# Patient Record
Sex: Female | Born: 1960 | Race: White | Hispanic: No | Marital: Married | Smoking: Former smoker
Health system: Southern US, Community
[De-identification: ages and names within clinical notes are randomized; demographics above are authoritative.]

## PROBLEM LIST (undated history)

## (undated) DIAGNOSIS — K219 Gastro-esophageal reflux disease without esophagitis: Secondary | ICD-10-CM

## (undated) DIAGNOSIS — E669 Obesity, unspecified: Secondary | ICD-10-CM

## (undated) DIAGNOSIS — F329 Major depressive disorder, single episode, unspecified: Secondary | ICD-10-CM

## (undated) DIAGNOSIS — F419 Anxiety disorder, unspecified: Secondary | ICD-10-CM

## (undated) DIAGNOSIS — T7840XA Allergy, unspecified, initial encounter: Secondary | ICD-10-CM

## (undated) DIAGNOSIS — F32A Depression, unspecified: Secondary | ICD-10-CM

## (undated) DIAGNOSIS — G4733 Obstructive sleep apnea (adult) (pediatric): Secondary | ICD-10-CM

## (undated) DIAGNOSIS — K52832 Lymphocytic colitis: Secondary | ICD-10-CM

## (undated) HISTORY — PX: TUBAL LIGATION: SHX77

## (undated) HISTORY — DX: Obesity, unspecified: E66.9

## (undated) HISTORY — DX: Lymphocytic colitis: K52.832

## (undated) HISTORY — DX: Depression, unspecified: F32.A

## (undated) HISTORY — DX: Obstructive sleep apnea (adult) (pediatric): G47.33

## (undated) HISTORY — DX: Gastro-esophageal reflux disease without esophagitis: K21.9

## (undated) HISTORY — DX: Allergy, unspecified, initial encounter: T78.40XA

## (undated) HISTORY — PX: COLONOSCOPY: SHX174

## (undated) HISTORY — DX: Major depressive disorder, single episode, unspecified: F32.9

## (undated) HISTORY — DX: Anxiety disorder, unspecified: F41.9

## (undated) HISTORY — PX: POLYPECTOMY: SHX149

---

## 2000-05-30 ENCOUNTER — Other Ambulatory Visit: Admission: RE | Admit: 2000-05-30 | Discharge: 2000-05-30 | Payer: Self-pay | Admitting: Obstetrics and Gynecology

## 2001-06-01 ENCOUNTER — Other Ambulatory Visit: Admission: RE | Admit: 2001-06-01 | Discharge: 2001-06-01 | Payer: Self-pay | Admitting: Obstetrics and Gynecology

## 2004-03-08 ENCOUNTER — Ambulatory Visit: Payer: Self-pay | Admitting: Internal Medicine

## 2004-07-03 ENCOUNTER — Ambulatory Visit: Payer: Self-pay | Admitting: Internal Medicine

## 2004-07-19 ENCOUNTER — Ambulatory Visit: Payer: Self-pay | Admitting: Internal Medicine

## 2005-10-02 ENCOUNTER — Ambulatory Visit: Payer: Self-pay | Admitting: Internal Medicine

## 2005-10-16 ENCOUNTER — Ambulatory Visit: Payer: Self-pay | Admitting: Internal Medicine

## 2005-10-22 ENCOUNTER — Ambulatory Visit: Payer: Self-pay | Admitting: Family Medicine

## 2005-10-30 ENCOUNTER — Ambulatory Visit: Payer: Self-pay | Admitting: Internal Medicine

## 2005-11-20 ENCOUNTER — Ambulatory Visit: Payer: Self-pay | Admitting: Internal Medicine

## 2006-01-01 ENCOUNTER — Ambulatory Visit: Payer: Self-pay | Admitting: Internal Medicine

## 2006-03-04 ENCOUNTER — Ambulatory Visit: Payer: Self-pay | Admitting: Internal Medicine

## 2006-06-10 ENCOUNTER — Ambulatory Visit: Payer: Self-pay | Admitting: Internal Medicine

## 2006-07-17 ENCOUNTER — Encounter: Payer: Self-pay | Admitting: *Deleted

## 2006-07-17 DIAGNOSIS — L719 Rosacea, unspecified: Secondary | ICD-10-CM | POA: Insufficient documentation

## 2006-07-17 DIAGNOSIS — F39 Unspecified mood [affective] disorder: Secondary | ICD-10-CM

## 2006-07-17 DIAGNOSIS — J309 Allergic rhinitis, unspecified: Secondary | ICD-10-CM | POA: Insufficient documentation

## 2006-12-30 ENCOUNTER — Ambulatory Visit: Payer: Self-pay | Admitting: Internal Medicine

## 2007-09-18 ENCOUNTER — Ambulatory Visit: Payer: Self-pay | Admitting: Internal Medicine

## 2007-09-18 DIAGNOSIS — R5381 Other malaise: Secondary | ICD-10-CM | POA: Insufficient documentation

## 2007-09-18 DIAGNOSIS — F329 Major depressive disorder, single episode, unspecified: Secondary | ICD-10-CM | POA: Insufficient documentation

## 2007-09-18 DIAGNOSIS — R5383 Other fatigue: Secondary | ICD-10-CM

## 2007-09-18 DIAGNOSIS — K219 Gastro-esophageal reflux disease without esophagitis: Secondary | ICD-10-CM

## 2007-09-18 DIAGNOSIS — R109 Unspecified abdominal pain: Secondary | ICD-10-CM | POA: Insufficient documentation

## 2007-09-21 LAB — CONVERTED CEMR LAB
ALT: 39 units/L — ABNORMAL HIGH (ref 0–35)
Alkaline Phosphatase: 80 units/L (ref 39–117)
BUN: 10 mg/dL (ref 6–23)
Basophils Absolute: 0 10*3/uL (ref 0.0–0.1)
Basophils Relative: 0 % (ref 0–1)
Calcium: 9.8 mg/dL (ref 8.4–10.5)
Chloride: 103 meq/L (ref 96–112)
Eosinophils Relative: 2 % (ref 0–5)
Glucose, Bld: 113 mg/dL — ABNORMAL HIGH (ref 70–99)
HCT: 45 % (ref 36.0–46.0)
Lymphs Abs: 4.2 10*3/uL — ABNORMAL HIGH (ref 0.7–4.0)
MCHC: 32.9 g/dL (ref 30.0–36.0)
Monocytes Relative: 7 % (ref 3–12)
Neutro Abs: 6.2 10*3/uL (ref 1.7–7.7)
Neutrophils Relative %: 54 % (ref 43–77)
Platelets: 364 10*3/uL (ref 150–400)
RDW: 13.1 % (ref 11.5–15.5)
TSH: 2.253 microintl units/mL (ref 0.350–5.50)
Total Bilirubin: 0.4 mg/dL (ref 0.3–1.2)
Total Protein: 7.2 g/dL (ref 6.0–8.3)

## 2007-10-14 ENCOUNTER — Ambulatory Visit: Payer: Self-pay | Admitting: Internal Medicine

## 2007-10-14 DIAGNOSIS — R197 Diarrhea, unspecified: Secondary | ICD-10-CM

## 2007-10-14 DIAGNOSIS — R1084 Generalized abdominal pain: Secondary | ICD-10-CM | POA: Insufficient documentation

## 2007-10-14 DIAGNOSIS — K625 Hemorrhage of anus and rectum: Secondary | ICD-10-CM

## 2007-11-03 ENCOUNTER — Encounter: Payer: Self-pay | Admitting: Internal Medicine

## 2007-11-03 ENCOUNTER — Ambulatory Visit: Payer: Self-pay | Admitting: Internal Medicine

## 2007-11-03 LAB — HM COLONOSCOPY

## 2007-11-05 ENCOUNTER — Ambulatory Visit: Payer: Self-pay | Admitting: Internal Medicine

## 2007-11-08 ENCOUNTER — Encounter: Payer: Self-pay | Admitting: Internal Medicine

## 2007-12-02 ENCOUNTER — Ambulatory Visit: Payer: Self-pay | Admitting: Internal Medicine

## 2007-12-02 DIAGNOSIS — Z860100 Personal history of colon polyps, unspecified: Secondary | ICD-10-CM | POA: Insufficient documentation

## 2007-12-02 DIAGNOSIS — K5730 Diverticulosis of large intestine without perforation or abscess without bleeding: Secondary | ICD-10-CM | POA: Insufficient documentation

## 2007-12-02 DIAGNOSIS — K5289 Other specified noninfective gastroenteritis and colitis: Secondary | ICD-10-CM

## 2008-01-08 ENCOUNTER — Encounter: Payer: Self-pay | Admitting: Internal Medicine

## 2008-02-18 ENCOUNTER — Emergency Department (HOSPITAL_COMMUNITY): Admission: EM | Admit: 2008-02-18 | Discharge: 2008-02-18 | Payer: Self-pay | Admitting: Emergency Medicine

## 2008-02-23 ENCOUNTER — Ambulatory Visit (HOSPITAL_COMMUNITY): Admission: RE | Admit: 2008-02-23 | Discharge: 2008-02-24 | Payer: Self-pay | Admitting: Orthopaedic Surgery

## 2008-08-05 ENCOUNTER — Ambulatory Visit: Payer: Self-pay | Admitting: Internal Medicine

## 2008-08-05 ENCOUNTER — Encounter: Payer: Self-pay | Admitting: Internal Medicine

## 2008-08-05 ENCOUNTER — Other Ambulatory Visit: Admission: RE | Admit: 2008-08-05 | Discharge: 2008-08-05 | Payer: Self-pay | Admitting: Internal Medicine

## 2008-08-05 DIAGNOSIS — J4520 Mild intermittent asthma, uncomplicated: Secondary | ICD-10-CM

## 2008-08-05 LAB — HM PAP SMEAR

## 2008-08-09 ENCOUNTER — Encounter: Payer: Self-pay | Admitting: Internal Medicine

## 2008-08-09 LAB — CONVERTED CEMR LAB
ALT: 22 units/L (ref 0–35)
AST: 23 units/L (ref 0–37)
BUN: 7 mg/dL (ref 6–23)
Bilirubin, Direct: 0.1 mg/dL (ref 0.0–0.3)
Calcium: 9.2 mg/dL (ref 8.4–10.5)
Eosinophils Absolute: 0.3 10*3/uL (ref 0.0–0.7)
Eosinophils Relative: 2.9 % (ref 0.0–5.0)
Glucose, Bld: 94 mg/dL (ref 70–99)
HDL: 45 mg/dL (ref >39.00)
Lymphocytes Relative: 35 % (ref 12.0–46.0)
Monocytes Relative: 6.8 % (ref 3.0–12.0)
Neutrophils Relative %: 54.5 % (ref 43.0–77.0)
Platelets: 336 10*3/uL (ref 150.0–400.0)
RBC: 4.39 M/uL (ref 3.87–5.11)
RDW: 12.8 % (ref 11.5–14.6)
TSH: 1.85 microintl units/mL (ref 0.35–5.50)
Triglycerides: 166 mg/dL — ABNORMAL HIGH (ref 0.0–149.0)
WBC: 8.9 10*3/uL (ref 4.5–10.5)

## 2008-08-17 ENCOUNTER — Ambulatory Visit: Payer: Self-pay | Admitting: Pulmonary Disease

## 2008-09-06 ENCOUNTER — Encounter: Payer: Self-pay | Admitting: Pulmonary Disease

## 2008-09-06 ENCOUNTER — Ambulatory Visit (HOSPITAL_BASED_OUTPATIENT_CLINIC_OR_DEPARTMENT_OTHER): Admission: RE | Admit: 2008-09-06 | Discharge: 2008-09-06 | Payer: Self-pay | Admitting: Pulmonary Disease

## 2008-09-13 ENCOUNTER — Ambulatory Visit: Payer: Self-pay | Admitting: Pulmonary Disease

## 2008-09-16 ENCOUNTER — Encounter: Payer: Self-pay | Admitting: Pulmonary Disease

## 2008-09-20 ENCOUNTER — Ambulatory Visit: Payer: Self-pay | Admitting: Pulmonary Disease

## 2008-09-20 DIAGNOSIS — G4733 Obstructive sleep apnea (adult) (pediatric): Secondary | ICD-10-CM

## 2008-09-20 DIAGNOSIS — F17200 Nicotine dependence, unspecified, uncomplicated: Secondary | ICD-10-CM

## 2008-11-02 ENCOUNTER — Encounter: Payer: Self-pay | Admitting: Pulmonary Disease

## 2008-11-28 ENCOUNTER — Ambulatory Visit: Payer: Self-pay | Admitting: Pulmonary Disease

## 2008-12-27 ENCOUNTER — Encounter: Payer: Self-pay | Admitting: Pulmonary Disease

## 2009-02-01 ENCOUNTER — Ambulatory Visit: Payer: Self-pay | Admitting: Internal Medicine

## 2009-02-20 HISTORY — PX: ANKLE SURGERY: SHX546

## 2009-05-15 ENCOUNTER — Ambulatory Visit: Payer: Self-pay | Admitting: Internal Medicine

## 2009-05-15 ENCOUNTER — Telehealth: Payer: Self-pay | Admitting: Internal Medicine

## 2009-05-15 DIAGNOSIS — J019 Acute sinusitis, unspecified: Secondary | ICD-10-CM

## 2009-05-24 ENCOUNTER — Telehealth: Payer: Self-pay | Admitting: Internal Medicine

## 2009-08-09 ENCOUNTER — Ambulatory Visit: Payer: Self-pay | Admitting: Internal Medicine

## 2009-08-10 LAB — CONVERTED CEMR LAB
ALT: 23 units/L (ref 0–35)
Alkaline Phosphatase: 86 units/L (ref 39–117)
BUN: 8 mg/dL (ref 6–23)
Basophils Absolute: 0.1 10*3/uL (ref 0.0–0.1)
Eosinophils Absolute: 0.3 10*3/uL (ref 0.0–0.7)
Glucose, Bld: 104 mg/dL — ABNORMAL HIGH (ref 70–99)
HCT: 42.5 % (ref 36.0–46.0)
Hemoglobin: 14.9 g/dL (ref 12.0–15.0)
Lymphocytes Relative: 33.3 % (ref 12.0–46.0)
MCHC: 35 g/dL (ref 30.0–36.0)
MCV: 92.4 fL (ref 78.0–100.0)
Monocytes Absolute: 0.7 10*3/uL (ref 0.1–1.0)
Platelets: 357 10*3/uL (ref 150.0–400.0)
Potassium: 4.7 meq/L (ref 3.5–5.1)
RBC: 4.6 M/uL (ref 3.87–5.11)
RDW: 13.8 % (ref 11.5–14.6)
TSH: 1.78 microintl units/mL (ref 0.35–5.50)
WBC: 9.7 10*3/uL (ref 4.5–10.5)

## 2009-08-18 ENCOUNTER — Encounter: Payer: Self-pay | Admitting: Internal Medicine

## 2009-08-22 ENCOUNTER — Encounter: Payer: Self-pay | Admitting: Internal Medicine

## 2009-08-24 ENCOUNTER — Encounter: Payer: Self-pay | Admitting: Internal Medicine

## 2009-08-28 ENCOUNTER — Encounter: Payer: Self-pay | Admitting: Internal Medicine

## 2009-09-01 ENCOUNTER — Encounter: Payer: Self-pay | Admitting: Internal Medicine

## 2010-02-22 ENCOUNTER — Encounter: Payer: Self-pay | Admitting: Internal Medicine

## 2010-03-09 ENCOUNTER — Encounter: Payer: Self-pay | Admitting: Internal Medicine

## 2010-03-09 DIAGNOSIS — R928 Other abnormal and inconclusive findings on diagnostic imaging of breast: Secondary | ICD-10-CM | POA: Insufficient documentation

## 2010-03-12 ENCOUNTER — Encounter: Payer: Self-pay | Admitting: Internal Medicine

## 2010-03-13 ENCOUNTER — Encounter: Payer: Self-pay | Admitting: Internal Medicine

## 2010-04-26 IMAGING — CR DG ANKLE COMPLETE 3+V*R*
3 series · 3 of 3 positions shown · non-contrast
Comparison: None

CLINICAL DATA: Fell down steps.  Right ankle pain.

RIGHT ANKLE - COMPLETE 3+ VIEW

[view not recorded (1 of 3)]
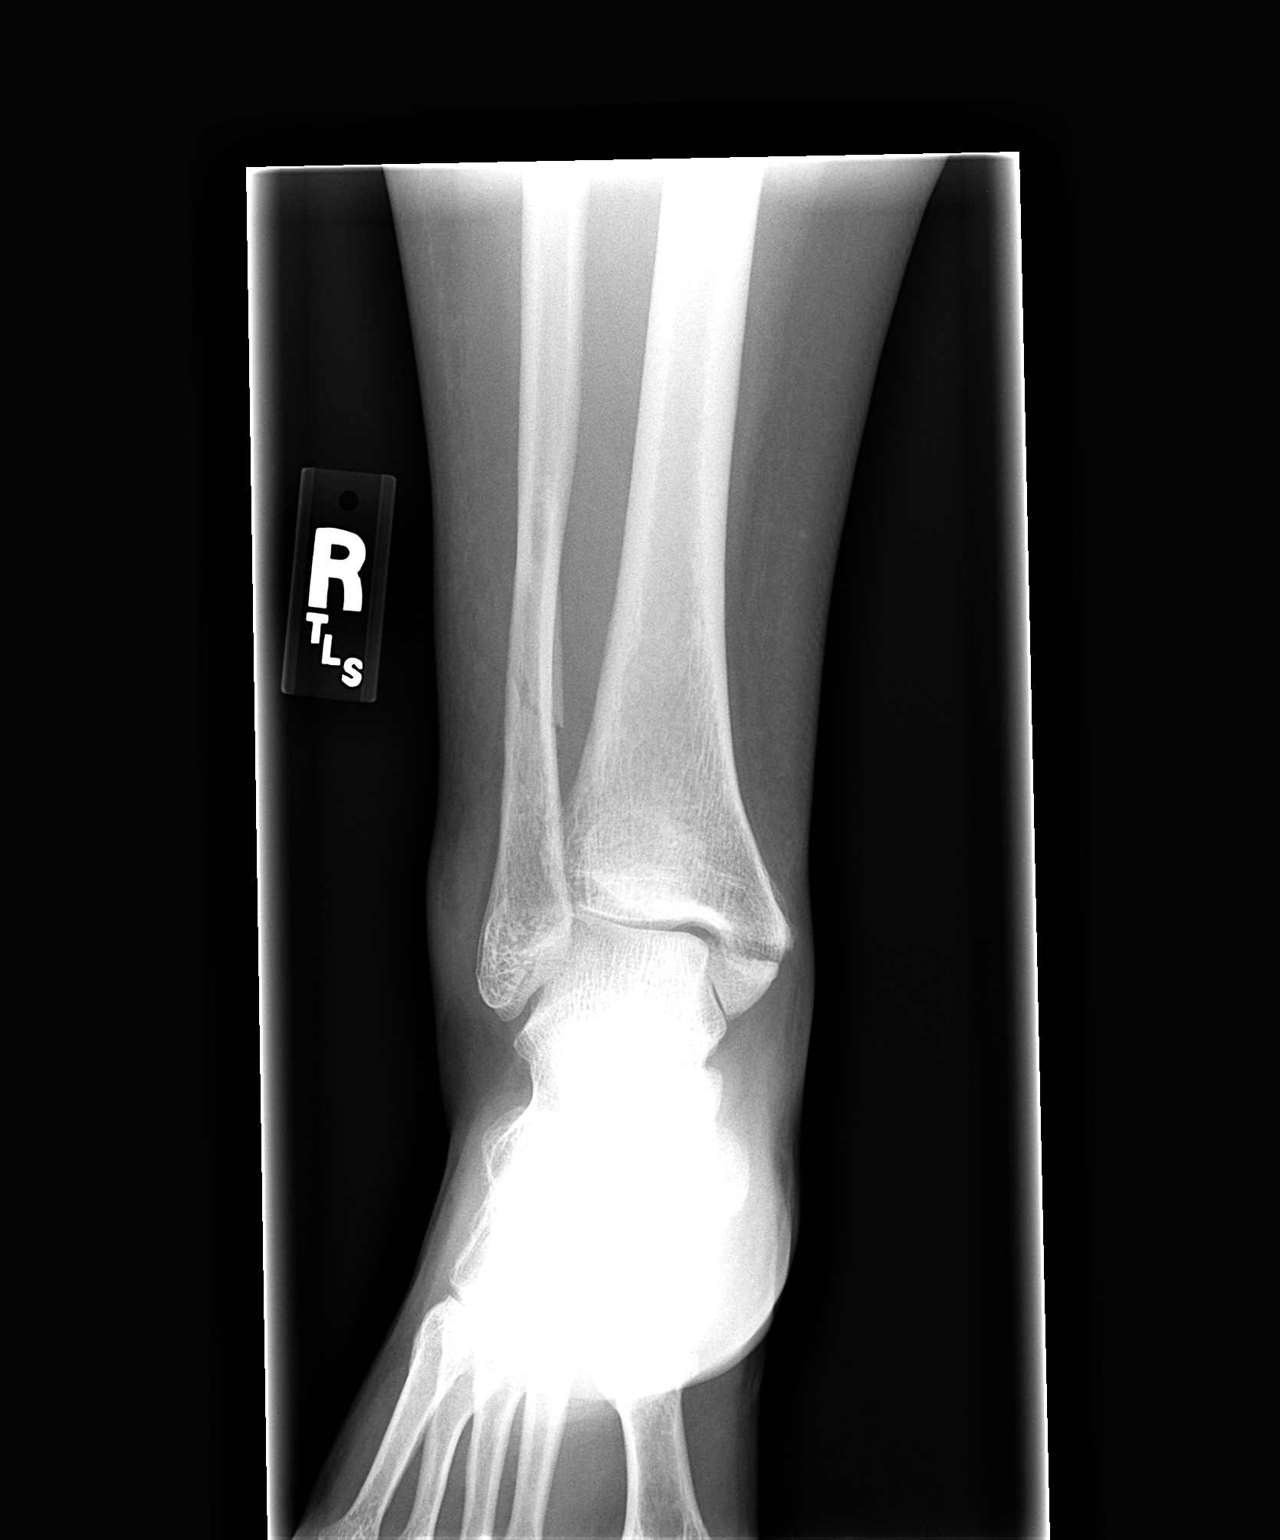

[view not recorded (2 of 3)]
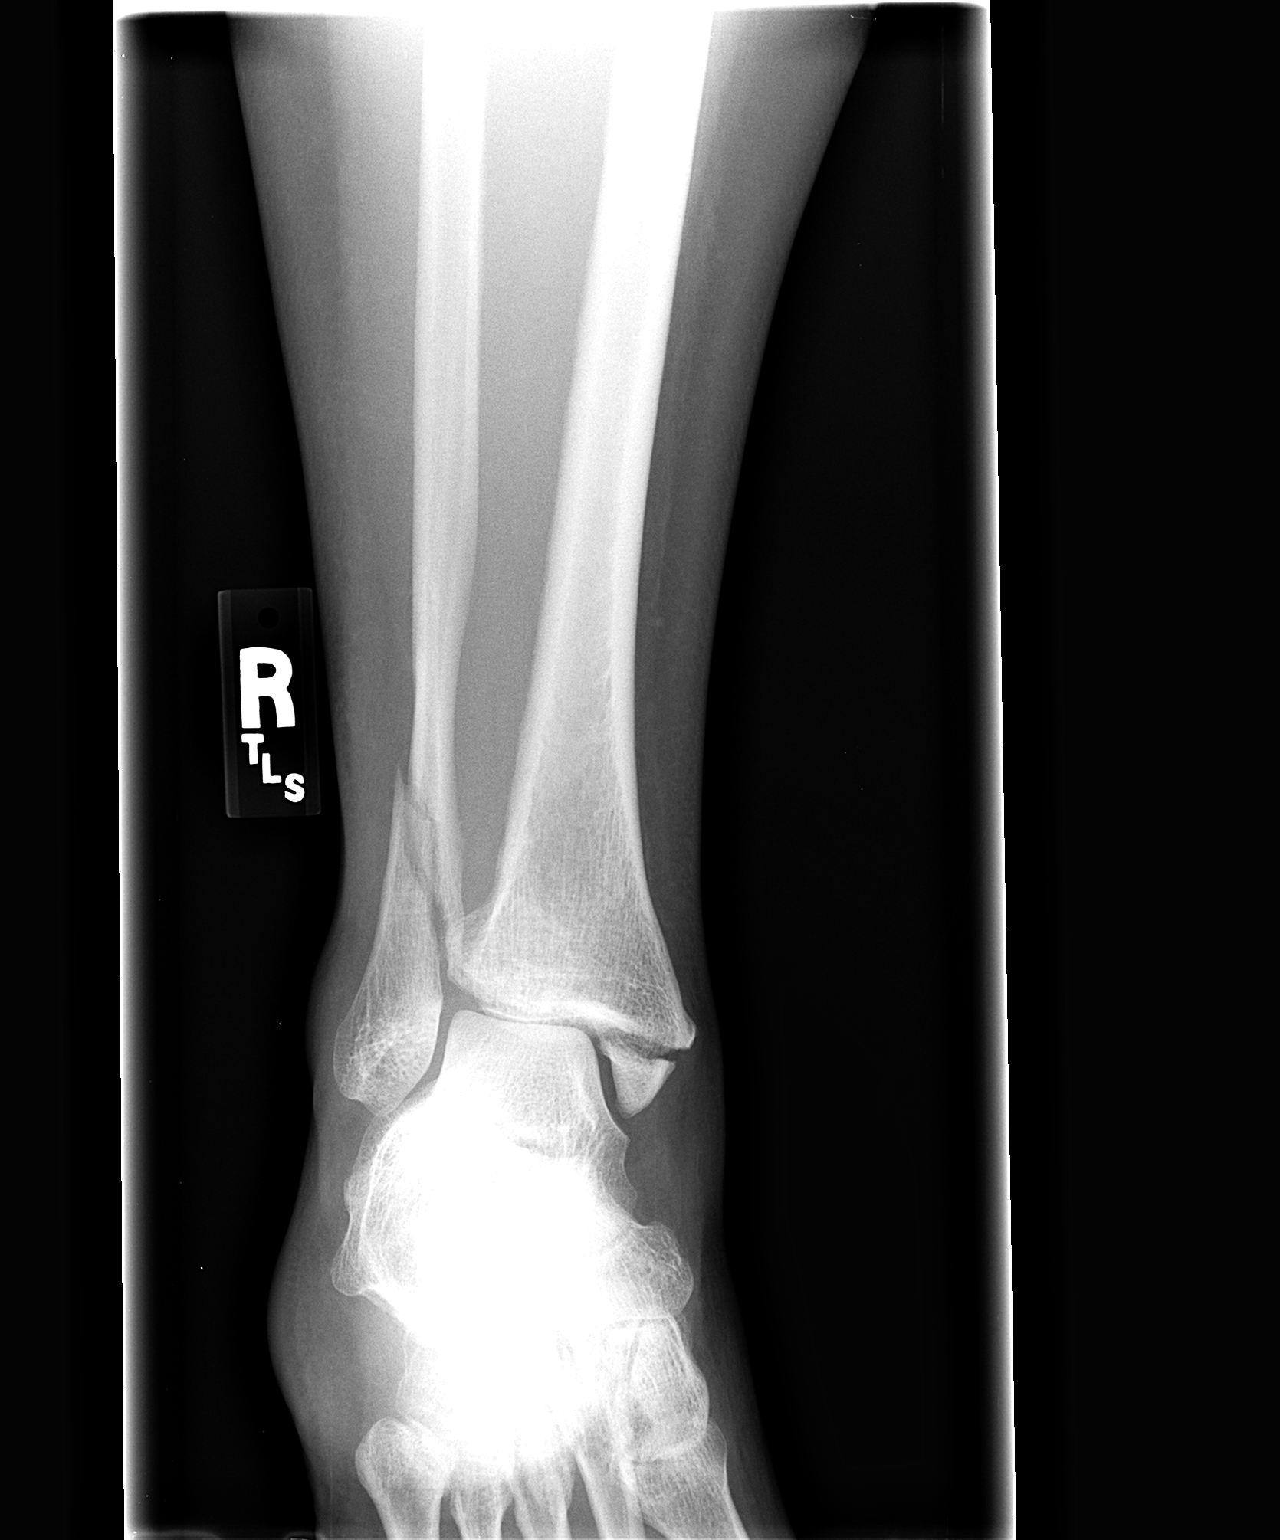

[view not recorded (3 of 3)]
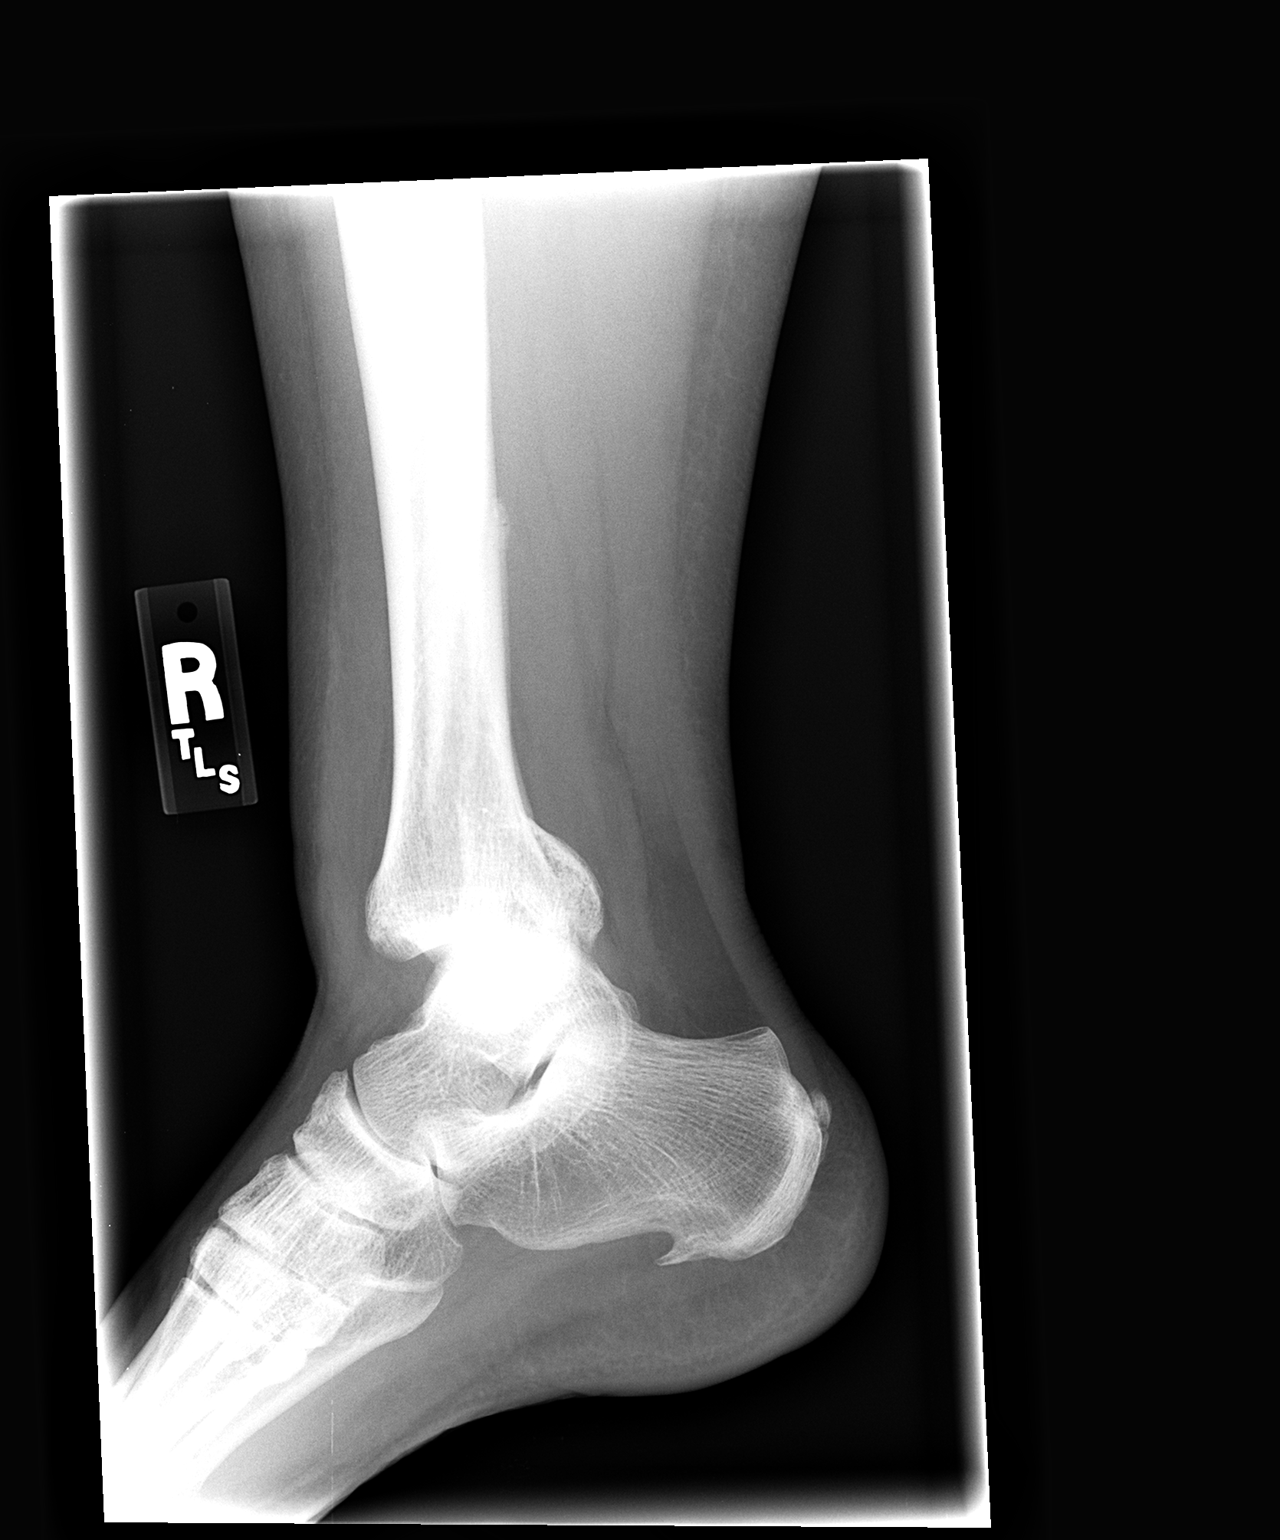

[3 of 3 positions shown; findings below may reference images not displayed]

FINDINGS: There is an oblique fracture of the fibula, associated
with soft tissue swelling.  There is a transverse fracture of the
medial malleolus.  Fracture is also identified of the posterior
malleolus.  Tibial fractures are also associated soft tissue
swelling.  Plantar calcaneal spur is noted.
IMPRESSION: Trimalleolar fracture.  Associated soft tissue swelling.

## 2010-05-01 IMAGING — RF DG ANKLE 2V *R*
1 series · 2 of 2 positions shown · non-contrast
Comparison: Plain films 02/18/2008.

CLINICAL DATA: ORIF trimalleolar fracture.

RIGHT ANKLE - 2 VIEW

[Series 1: selected · 2 of 2 slices shown]
[im 1/2]
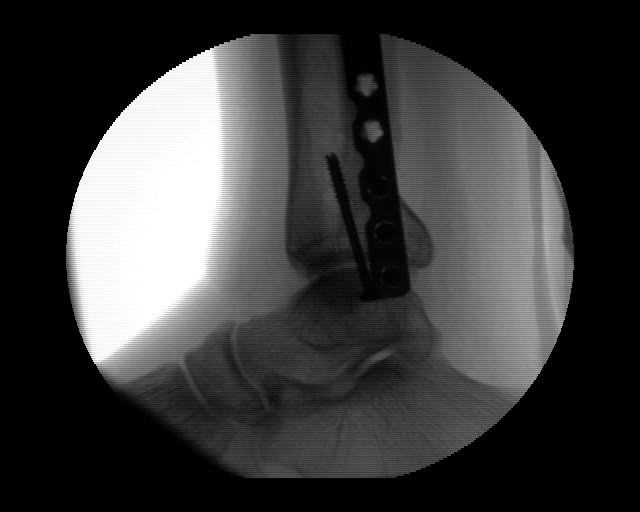
[im 2/2]
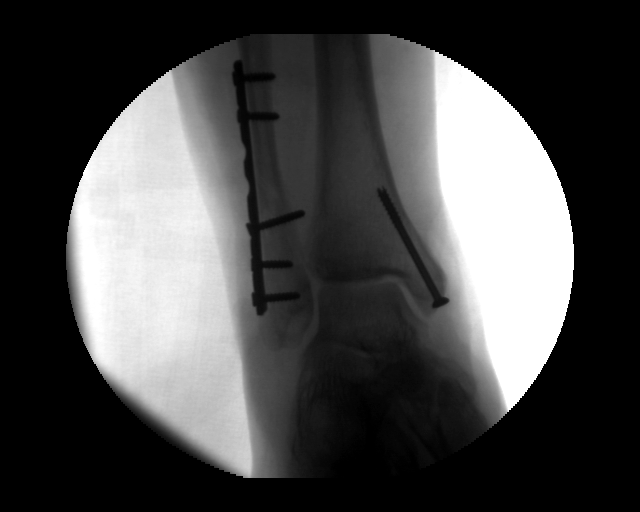

[2 of 2 positions shown; findings below may reference images not displayed]

FINDINGS: We are provided with two fluoroscopic intraoperative spot
views of the right ankle.  Plate and screws are seen fixing a
distal fibular fracture with a lag screw fixing a medial malleolar
fracture.  Posterior tibiotalar subluxation has been reduced.
IMPRESSION: ORIF of trimalleolar fracture without complicating features.

## 2010-05-24 NOTE — Assessment & Plan Note (Signed)
Summary: cough   Vital Signs:  Patient profile:   50 year old female Weight:      235 pounds BMI:     43.14 Temp:     98.3 degrees F oral Pulse rate:   72 / minute Pulse rhythm:   regular Resp:     12 per minute BP sitting:   138 / 80  (left arm) Cuff size:   large  Vitals Entered By: Mervin Hack CMA Duncan Dull) (May 15, 2009 4:53 PM) CC: cough/ congestion   History of Present Illness: Started with cold 2 weeks ago seemed to get better Louann Sjogren was sick last week with lots of cough  Now with severe cough over the past 3 days Gets pain in head, neck and back due to the severity of the cough AM productive of green, brown sputum. Clear other days has some wheezing  Not really SOB hasn't been using either of her inhalers  No fever Cold chills at work always has sweats at night  Has "raw" throat right ear hurts  Tried mucinex, daytime cold formula---seemed to help some  Allergies: 1)  ! Sulfa 2)  ! * Protuss Dm  Past History:  Past medical, surgical, family and social histories (including risk factors) reviewed for relevance to current acute and chronic problems.  Past Medical History: Reviewed history from 02/01/2009 and no changes required. Allergic rhinitis Depression GERD Anxiety Disorder Obesity Lymphocytic colitis diagnosed 11-03-2007 Asthma--with colds and alergies Obstructive sleep apnea    Past Surgical History: Reviewed history from 08/05/2008 and no changes required. Vaginal deliveries x 2 Tubal ligation 1/84 Broken right ankle--surgery 11/10  -------------Dr Rayburn Ma  Family History: Reviewed history from 08/05/2008 and no changes required. Dad died @63  head and neck cancer Mom has DM, osteroporosis, Altzheimers (60's) MAt GF died of esophageal and colon cancer, had MI also Only child Mat GM died of MI @48  Some HTN in family DM in mat aunt and great aunt also No breast cancer  Social History: Reviewed history from 08/17/2008 and no  changes required. 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  NAD Head:  no frontal or maxillary tenderness Ears:  R ear normal and L ear normal.   Nose:  moderate inflammation with green mucus on right Mouth:  no erythema and no exudates.   Neck:  supple and no masses.  Mild tender bilat ant cervical nodes Lungs:  normal respiratory effort, no intercostal retractions, and no accessory muscle use.  Slight expiratory wheeze---not really tight or with prolonged exp phase   Impression & Recommendations:  Problem # 1:  SINUSITIS - ACUTE-NOS (ICD-461.9) Assessment New  seems to have secondary bacterial infection will begin Rx with amoxil if not improving, would change to augmentin  Her updated medication list for this problem includes:    Amoxicillin 500 Mg Tabs (Amoxicillin) .Marland Kitchen... 2 tabs by mouth two times a day for sinus infection  Problem # 2:  ASTHMA (ICD-493.90) Assessment: Deteriorated very slight exacerbation in general, mild and extremely intermittent--hasn't used advair will refill proair consider 5 day prednisone if wheezing worsens  The following medications were removed from the medication list:    Advair Diskus 250-50 Mcg/dose Misc (Fluticasone-salmeterol) .Marland Kitchen... Daily prn Her updated medication list for this problem includes:    Proair Hfa 108 (  90 Base) Mcg/act Aers (Albuterol sulfate) .Marland Kitchen... 2 puffs with spacer three times a day as needed for asthma  Complete Medication List: 1)  Alavert 10 Mg Tabs (Loratadine) .... Take 1 tab daily. 2)  Paxil 20 Mg Tabs (Paroxetine hcl) .... Take 1 at bedtime 3)  Prilosec Otc 20 Mg Tbec (Omeprazole magnesium) .Marland Kitchen.. 1 tablet once daily to two times a day 4)  Lomotil 2.5-0.025 Mg Tabs (Diphenoxylate-atropine) .Marland Kitchen.. 1-2 tablets three times a  day as needed 5)  Proair Hfa 108 (90 Base) Mcg/act Aers (Albuterol sulfate) .... 2 puffs with spacer three times a day as needed for asthma 6)  Amoxicillin 500 Mg Tabs (Amoxicillin) .... 2 tabs by mouth two times a day for sinus infection  Patient Instructions: 1)  Keep regular follow up in April Prescriptions: AMOXICILLIN 500 MG TABS (AMOXICILLIN) 2 tabs by mouth two times a day for sinus infection  #40 x 0   Entered and Authorized by:   Cindee Salt MD   Signed by:   Cindee Salt MD on 05/15/2009   Method used:   [facility]        [facility] [id number]       [address]       [place], [place]  [address]       Ph: [phone] or [phone]       Fax: [phone]   RxID:   [id number] PROAIR HFA 108 (90 BASE) MCG/ACT AERS (ALBUTEROL SULFATE) 2 puffs with spacer three times a day as needed for asthma  #1 x 2   Entered and Authorized by:   Cindee Salt MD   Signed by:   Cindee Salt MD on 05/15/2009   Method used:   [facility]        [facility]       [address]       [place], [place]  [address]       Ph: [phone] or [phone]       Fax: [phone]   RxID:   [id number]   Current Allergies (reviewed today): ! SULFA ! * PROTUSS DM

## 2010-05-24 NOTE — Progress Notes (Signed)
Summary: not feeling any better  Phone Note Call from Patient Call back at [phone]   Caller: Patient Call For: Cindee Salt MD Summary of Call: Patient was seen on 05-15-09 for sinus infection and was given amoxicillin. She says that she still has productive cough and alot of chest congestion. She says she is back at work now, but really doesn't feel any better than she did before. She wants to know if she can have something else called in for her to [facility] [address].  Initial call taken by: Melody Comas,  May 24, 2009 10:46 AM  Follow-up for Phone Call        please send Rx for augmentin 875mg  two times a day  #20 x 0 take after eating Follow-up by: Cindee Salt MD,  May 24, 2009 11:12 AM  Additional Follow-up for Phone Call Additional follow up Details #1::        Rx Called In, Spoke with patient and advised results.  Additional Follow-up by: Mervin Hack CMA Duncan Dull),  May 24, 2009 1:51 PM    New/Updated Medications: AMOXICILLIN-POT CLAVULANATE 875-125 MG TABS (AMOXICILLIN-POT CLAVULANATE) take 1 by mouth two times a day Prescriptions: AMOXICILLIN-POT CLAVULANATE 875-125 MG TABS (AMOXICILLIN-POT CLAVULANATE) take 1 by mouth two times a day  #20 x 0   Entered by:   Mervin Hack CMA (AAMA)   Authorized by:   Cindee Salt MD   Signed by:   Mervin Hack CMA (AAMA) on 05/24/2009   Method used:   Electronically to        [facility] [phone][facility] (retail)       [address]       [place], [place]  [address]       Ph: [phone]       Fax: [phone]   RxID:   [id number]

## 2010-05-24 NOTE — Progress Notes (Signed)
Summary: pt questions bronchitis or pneumonia  Phone Note Call from Patient Call back at [phone] ext [phone]   Caller: Patient Call For: Cindee Salt MD Summary of Call: Pt has non productive cough during the day. First thing in the morning has brownish phlegm. Pt is hurting in back on both sides where lungs are.  Pt is wheezing and has sorethroat. Had head congestion few weeks ago but no head congestion now. No trouble getting her breath and no fever. Pt said coughing started on Sat 05/13/09. Pt concerned it will turn into pneumonia. [facility] [phone] if pharmacy needed. I did not tell pt this but Geraldine Contras said to mention to you no one has available appt today. Please advise.  Initial call taken by: Lewanda Rife LPN,  May 15, 2009 1:06 PM  Follow-up for Phone Call        sounds like she needs assessment Please add her on at end of day Follow-up by: Cindee Salt MD,  May 15, 2009 1:17 PM  Additional Follow-up for Phone Call Additional follow up Details #1::        Called patient and scheduled her for 4:45.  Additional Follow-up by: Melody Comas,  May 15, 2009 2:17 PM

## 2010-05-24 NOTE — Miscellaneous (Signed)
Summary: Orders Update  Clinical Lists Changes  Problems: Added new problem of ABNORMAL MAMMOGRAM (ICD-793.80) Orders: Added new Referral order of Radiology Referral (Radiology) - Signed

## 2010-05-24 NOTE — Letter (Signed)
Summary: Results Follow up Letter  [place] [facility]  [address] [place], [place] [address]   Phone: [phone]  Fax: [phone]    03/13/2010 MRN: [id number]  Julia Mejia [address] [place], [place]  [address]  Dear Ms. Connell,  The following are the results of your recent test(s):  Test         Result    Pap Smear:        Normal _____  Not Normal _____ Comments: ______________________________________________________ Cholesterol: LDL(Bad cholesterol):         Your goal is less than:         HDL (Good cholesterol):       Your goal is more than: Comments:  ______________________________________________________ Mammogram:        Normal _____  Not Normal _____ Comments:the mammo and ultrasound look about the same There are some cysts on the ultrasound that they would still like to recheck again in 6 months  ___________________________________________________________________ Hemoccult:        Normal _____  Not normal _______ Comments:    _____________________________________________________________________ Other Tests: WE HAVE BEEN TRYING TO REACH YOU BY TELEPHONE AT HOME AT [phone]. PLEASE CALL IF YOU HAVE ANY QUESTIONS.   We routinely do not discuss normal results over the telephone.  If you desire a copy of the results, or you have any questions about this information we can discuss them at your next office visit.   Sincerely,      Tillman Abide, MD

## 2010-05-24 NOTE — Assessment & Plan Note (Signed)
Summary: CPX.DLO   Vital Signs:  Patient profile:   50 year old female Weight:      232 pounds Temp:     98.3 degrees F oral Pulse rate:   76 / minute Pulse rhythm:   regular BP sitting:   120 / 80  (left arm) Cuff size:   large  Vitals Entered By: Mervin Hack CMA Duncan Dull) (August 09, 2009 8:31 AM) CC: adult physical   History of Present Illness: Very concerned about her weight Considering bariatric clinic  Not much exercise---walking still causes ankle pain can't walk more than 1 mile Discussed lace up brace  No set dietary plan does try to stay away from potatoes, bread and fried food No drinks with calories  Now using CPAP machine can sleep with husband again    Allergies: 1)  ! Sulfa 2)  ! * Protuss Dm  Past History:  Past medical, surgical, family and social histories (including risk factors) reviewed for relevance to current acute and chronic problems.  Past Medical History: Reviewed history from 02/01/2009 and no changes required. Allergic rhinitis Depression GERD Anxiety Disorder Obesity Lymphocytic colitis diagnosed 11-03-2007 Asthma--with colds and alergies Obstructive sleep apnea    Past Surgical History: Reviewed history from 08/05/2008 and no changes required. Vaginal deliveries x 2 Tubal ligation 1/84 Broken right ankle--surgery 11/10  -------------Dr Rayburn Ma  Family History: Reviewed history from 08/05/2008 and no changes required. Dad died @63  head and neck cancer Mom has DM, osteroporosis, Altzheimers (60's) MAt GF died of esophageal and colon cancer, had MI also Only child Mat GM died of MI @48  Some HTN in family DM in mat aunt and great aunt also No breast cancer  Social History: Reviewed history from 08/17/2008 and no changes required. 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 Eyes:  Denies double vision and vision loss-1 eye; due for exam. ENT:  Denies decreased hearing and ringing in ears; teeth okay--regular with dentist. CV:  Denies chest pain or discomfort, difficulty breathing at night, fainting, lightheadness, palpitations, and shortness of breath with exertion. Resp:  Complains of cough; denies shortness of breath; cough---?smoking related or allergy. GI:  Complains of change in bowel habits and indigestion; denies abdominal pain, bloody stools, dark tarry stools, nausea, and vomiting; needs prevacid two times a day  IBS and colitis---may have 5-6 stools per day, mostly in AM. GU:  Complains of incontinence; denies dysuria; occ doesn't empty bladder completely No sex--no probelm. MS:  Denies joint swelling; ankle pain occ upper back pain. Derm:  Denies lesion(s) and rash; lots of moles some removed in past but none abnormal. Neuro:  Complains of headaches; denies numbness, tingling, and weakness; occ headaches--she thinks they are allergic and meds help. Psych:  Complains of anxiety and depression; medicine does help this gets "over anxious" at times---- may last only 30 minutes though. Able to focus and breathe and it resolves. Heme:  Denies abnormal bruising and enlarge lymph nodes. Allergy:  Complains of seasonal allergies and sneezing; uses allegra as needed .  Physical Exam  General:  alert.  NAD Eyes:  pupils equal, pupils round, pupils reactive to light, and no optic disk abnormalities.   Ears:  R ear normal and L ear normal.   Mouth:  no erythema, no exudates, and no lesions.   Neck:  supple,  no masses, no thyromegaly, no carotid bruits, and no cervical lymphadenopathy.   Breasts:  no abnormal thickening, no tenderness, and no adenopathy.  Mild cystic changes bilat Lungs:  normal respiratory effort and normal breath sounds.   Heart:  normal rate, regular rhythm, no murmur, and no gallop.    Abdomen:  soft and non-tender.   Msk:  no joint tenderness and no joint swelling.   Pulses:  1+ in feet Extremities:  no edema Neurologic:  alert & oriented X3, strength normal in all extremities, and gait normal.   Skin:  multiple benign nevi---2 on back are 7-47mm no suspicious lesions and no ulcerations.   Psych:  normally interactive, good eye contact, not anxious appearing, and not depressed appearing.     Impression & Recommendations:  Problem # 1:  PREVENTIVE HEALTH CARE (ICD-V70.0) Assessment Comment Only discussed diet and exercise not ready to stop smoking will get mammo  Problem # 2:  ASTHMA (ICD-493.90) Assessment: Unchanged mild and intermittent not clear if cough is related--?GERD related  Her updated medication list for this problem includes:    Proair Hfa 108 (90 Base) Mcg/act Aers (Albuterol sulfate) .Marland Kitchen... 2 puffs with spacer three times a day as needed for asthma  Problem # 3:  DEPRESSION (ICD-311) Assessment: Unchanged  controlled with meds  Her updated medication list for this problem includes:    Paxil 20 Mg Tabs (Paroxetine hcl) .Marland Kitchen... Take 1 at bedtime  Orders: Venipuncture (41324) TLB-Renal Function Panel (80069-RENAL) TLB-CBC Platelet - w/Differential (85025-CBCD) TLB-Hepatic/Liver Function Pnl (80076-HEPATIC) TLB-TSH (Thyroid Stimulating Hormone) (84443-TSH)  Complete Medication List: 1)  Alavert 10 Mg Tabs (Loratadine) .... Take 1 tab daily. 2)  Paxil 20 Mg Tabs (Paroxetine hcl) .... Take 1 at bedtime 3)  Prilosec Otc 20 Mg Tbec (Omeprazole magnesium) .Marland Kitchen.. 1 tablet once daily to two times a day 4)  Lomotil 2.5-0.025 Mg Tabs (Diphenoxylate-atropine) .Marland Kitchen.. 1-2 tablets three times a day as needed 5)  Proair Hfa 108 (90 Base) Mcg/act Aers (Albuterol sulfate) .... 2 puffs with spacer three times a day as needed for asthma  Other Orders: Radiology Referral (Radiology)  Patient Instructions: 1)  Please schedule a follow-up appointment in 1 year.   2)  Schedule your mammogram.  Prescriptions: PAXIL 20 MG  TABS (PAROXETINE HCL) Take 1 at bedtime  #90 Each x 3   Entered by:   Mervin Hack CMA (AAMA)   Authorized by:   Cindee Salt MD   Signed by:   Mervin Hack CMA (AAMA) on 08/09/2009   Method used:   Electronically to        [facility] [address] (retail)       [phone]       [place], [place]  [address]       Ph: [phone]       Fax: [phone]   RxID:   [id number]   Current Allergies (reviewed today): ! SULFA ! * PROTUSS DM

## 2010-08-31 ENCOUNTER — Other Ambulatory Visit: Payer: Self-pay | Admitting: Internal Medicine

## 2010-09-04 NOTE — Op Note (Signed)
Julia Mejia, Julia Mejia NO.:  [id number]   MEDICAL RECORD NO.:  [id number]          PATIENT TYPE:  OIB   LOCATION:  3019                         FACILITY:  [facility]   PHYSICIAN:  Vanita Panda. Magnus Ivan, M.D.DATE OF BIRTH:  June 01, 1960   DATE OF PROCEDURE:  02/23/2008  DATE OF DISCHARGE:                               OPERATIVE REPORT   PREOPERATIVE DIAGNOSES:  Right trimalleolar ankle fracture and  subluxation.   POSTOPERATIVE DIAGNOSES:  Right trimalleolar ankle fracture and  subluxation.   PROCEDURES:  1. Open reduction and internal fixation of right fibula/lateral      malleolus.  2. Open reduction and internal fixation of right medial malleolus      only.   SURGEON:  Vanita Panda. Magnus Ivan, MD   ANESTHESIA:  1. Right leg popliteal block.  2. General.   ANTIBIOTICS:  1 g IV Ancef.   TOURNIQUET TIME:  1 hour 10 minutes.   BLOOD LOSS:  100 mL.   COMPLICATIONS:  None.   INDICATIONS:  Briefly, Julia Mejia is a very pleasant 50 year old female  who last Thursday sustained a mechanical fall when she missed the last 2  steps going downstairs in her house.  She sustained a right ankle injury  and was seen by the ER physicians.  She had a subluxation and a  trimalleolar ankle fracture of the right ankle.  They reduced it in a  more acceptable alignment, put her in a splint, and she followed up in  my office.  She had exquisite pain, but remained with good sensation and  normal blood flow to her foot.  It was recommend that she undergo open  reduction and internal fixation of the trimalleolar ankle fracture.  I  discussed with her the idea of fixing the fibula as well as the medial  malleolus and addressing the posterior malleolar piece, pinning on the  sized insert of that piece, and the stability of the ankle.  Risks and  benefits were explained to her in length, and she agreed to proceed with  surgery.   PROCEDURE DESCRIPTION:  After informed consent was  obtained, appropriate  right ankle was marked, anesthesia was obtained with a popliteal block,  and she was brought to the operating room and placed supine on the  operating table.  General anesthesia was then obtained.  A nonsterile  tourniquet was placed around her upper right leg.  A time-out was called  to identify the correct patient and the correct right ankle.  An Esmarch  was used to wrap up the ankle and tourniquet was inflated to 300 mm of  pressure.  The leg was previously prepped and draped with DuraPrep and  sterile drapes prior to the tourniquet going up.  I then made an  incision along the lateral aspect of the ankle in the fibula and carried  this down the fracture site.  There was significant comminution at the  fracture site, and this was a difficult to obtain reduced position as  possible.  I attempted to place a lag screw, but it was unsuccessful.  I  then chose  a [employer] one-third semitubular locking plate and  fashioned this in an antiglide fashion along the posterolateral aspect  of the fibula.  I then secured this locking hole distally and then was  able to use reduction clamps to reduce the fracture.  I then placed 3  screws in the distal piece and 2 screws in the proximal piece and held  this in reduced position without having to lag the fracture.  This  basically bridged the significant comminution in the middle of the  fracture.  The fibula was almost out the length entirely, and I did feel  like I had the rotation better aligned.  Next, I addressed the medial  malleolus piece.  With the incision directly over the medial malleolus,  I dissected down to the medial malleolus.  This was a transverse  fracture that was a distal medial malleolus fracture and I felt this  could be reduced and fixed with 1 screw.  Once I cleaned the fracture  debris, I used a reduction forceps and held the fracture in place.  I  then placed a single threaded pin from the tip of  the fibula traversing  the fracture.  I drilled over this pin and then placed a size 46, 4.0  partially threaded cancellous screw and removed the pin.  This did hold  the fracture in place, and this was all accomplished under direct  fluoroscopic guidance.  In a lateral plane, the posterior piece was less  than 20% of the articular surface and the ankle mortise in the lateral  view was completely reduced.  I then removed all instrumentation and  guidewires, left the plates and screws in place, irrigated both wounds,  and closed the deep tissues with 0 Vicryl followed by 2-0 Vicryl in the  subcutaneous tissue and ended up with 2-0 nylon on both incisions on the  ankle.  Xeroform followed by well-padded sterile dressing were applied.  I then placed her in posterior and lateral splints.  The tourniquet was  let down at 110 minutes due to this being a venous tourniquet.  Hemostasis was easily obtained after the tourniquet was let down, and I  easily proceeded with fixation.  Her toes remained pink throughout the  case.  Postoperatively, I will keep her nonweightbearing on that ankle.      Vanita Panda. Magnus Ivan, M.D.  Electronically Signed     CYB/MEDQ  D:  02/23/2008  T:  02/24/2008  Job:  [id number]

## 2010-09-04 NOTE — Procedures (Signed)
NAME:  Julia Mejia, Julia Mejia NO.:  [id number]   MEDICAL RECORD NO.:  [id number]          PATIENT TYPE:  OUT   LOCATION:  SLEEP CENTER                 FACILITY:  [facility]   PHYSICIAN:  Oretha Milch, MD      DATE OF BIRTH:  24-Jun-1960   DATE OF STUDY:                            NOCTURNAL POLYSOMNOGRAM   REFERRING PHYSICIAN:   INDICATION FOR STUDY:  Excessive daytime somnolence, loud snoring,  nonrefreshing sleep and narrow pharyngeal exam in this 50 year old woman  with a weight of 222 pounds, height of 5 feet 2 inches, BMI 41, neck  size 14 inches.   EPWORTH SLEEPINESS SCORE:  7   MEDICATIONS:  Medications at the time of study was Paxil, trazodone,  Prilosec, loratadine, Advair.   This overnight polysomnogram was performed with a sleep technologist in  attendance.  EEG, EOG, EMG, EKG, and respiratory parameters were  recorded.  Sleep stages, arousals, limb movements, and respiratory data  were scored according to criteria laid out by the American Academy of  Sleep Medicine.   SLEEP ARCHITECTURE:  Lights out was at 10:30 p.m.  Lights on was at 5:49  a.m.  CPAP was initiated at 1:45 a.m.  During the diagnostic portion,  total sleep time was 151 minutes with a sleep period time of 160 minutes  and sleep efficiency of 78%.  Sleep latency was 35 minutes.  Latency to  REM sleep was 150 minutes.  Sleep stages as a percentage total sleep  time was N1 70%, N2 72%, N3 14%, and REM sleep 6.6% (10 minutes).  REM  was noted in 2 large periods at around 1:30 a.m. and 5:00 a.m.  During  the titration portion, REM sleep accounted for 76 minutes.   RESPIRATORY DATA:  During the diagnostic portion, there were 15  obstructive apneas, 0 central apneas, 1 mixed apneas, and 28 hypopneas  with an apnea-hypopnea index of 17.4 events per hour and a low  desaturation of 85%.  Due to this degree of respiratory disturbance,  CPAP was initiated at 5 cm and titrated due to respiratory events  and  snoring to a final level of 16 cm.  At the level of 9 cm for 17 minutes  of non-REM sleep, 2 hypopneas were noted with an AHI of 7 events per  hour and a low desaturation 92%.  At a level of 11 cm for 29 minutes of  non-REM sleep, 4 obstructive apneas, 18 central apneas, 9 mixed apneas,  and 7 hypopneas were noted with a low desaturation of 90%.  At a level  of 15 cm for 16 minutes of sleep including 8.5 minutes of REM sleep, 2  obstructive apneas, 10 central apneas, 1 mixed apnea were noted with an  AHI of 49 events per hour and a low desaturation of 91%.  At a final  level of 16 cm for 49 minutes of REM sleep, 1 obstructive apnea and 5  central apneas were noted.   AROUSAL DATA:  During the diagnostic portion, the arousal index was 45  events per hour.  Quite a few arousals were noted on the higher levels  of CPAP.  OXYGEN DATA:  The lowest desaturation during the diagnostic portion was  85% with desaturation index of 13 events per hour.  No desaturation was  noted on the higher levels of CPAP.   LIMB MOVEMENT DATA:  No significant limb movements were noted.   CARDIAC DATA:  No arrhythmias were noted.  The low heart rate during the  diagnostic portion was 60 beats per minute and the high heart rate was  115 beats per minute.   DISCUSSION:  Central apneas seem to recur at the sleep-wake interfaces.  CPAP was initiated at 5 cm and titrated to 16 cm due to snoring.  There  is difficulty titrating due to inability to maintain sleep in the supine  position.  She was desensitized with a medium full-face mask.   IMPRESSION:  1. Moderate obstructive sleep apnea with hypopneas causing sleep      fragmentation and oxygen desaturation.  2. This was corrected by CPAP of 16 cm with a medium full-face mask.  3. No evidence of cardiac arrhythmias, limb movements, or behavioral      disturbance during sleep.   RECOMMENDATIONS:  1. CPAP should be initiated with a goal of 16 cm with a  medium full-      face mask.  She should be evaluated for symptoms at this level.  2. She should be asked to avoid medications with sedative side      effects.  3. She should be asked to avoid driving when sleepy.      Oretha Milch, MD  Electronically Signed     RVA/MEDQ  D:  09/13/2008 12:32:46  T:  09/14/2008 03:39:07  Job:  [id number]

## 2010-10-03 ENCOUNTER — Telehealth: Payer: Self-pay | Admitting: Internal Medicine

## 2010-10-03 DIAGNOSIS — R928 Other abnormal and inconclusive findings on diagnostic imaging of breast: Secondary | ICD-10-CM

## 2010-10-03 NOTE — Telephone Encounter (Signed)
Called patient and she wants to go for the Bilateral diagnostic mammogram with left breast US at [facility]. Appt made for 10/11/2010 at 10:30. Need your order faxed to [facility] at [phone].

## 2010-10-08 ENCOUNTER — Encounter: Payer: Self-pay | Admitting: Internal Medicine

## 2010-10-12 ENCOUNTER — Encounter: Payer: Self-pay | Admitting: *Deleted

## 2010-10-15 ENCOUNTER — Encounter: Payer: Self-pay | Admitting: Internal Medicine

## 2010-11-26 ENCOUNTER — Other Ambulatory Visit: Payer: Self-pay | Admitting: Internal Medicine

## 2010-11-26 NOTE — Telephone Encounter (Signed)
Hasn't been seen in well over 1 year Okay to refill #30 x 0 and she needs appt before more

## 2010-11-27 NOTE — Telephone Encounter (Signed)
rx sent to pharmacy by e-script, SENT MESSAGE BY PHARMACY

## 2011-01-03 ENCOUNTER — Other Ambulatory Visit: Payer: Self-pay | Admitting: *Deleted

## 2011-01-03 MED ORDER — PAROXETINE HCL 20 MG PO TABS: ORAL_TABLET | ORAL | Status: DC

## 2011-01-03 NOTE — Telephone Encounter (Signed)
Spoke with patient and advised results   

## 2011-01-03 NOTE — Telephone Encounter (Signed)
Pt has appt to see you on 9/26 and she would like one refill until then.  Uses [facility].

## 2011-01-03 NOTE — Telephone Encounter (Signed)
Let her know another refill sent We will discuss her Rx at the visit

## 2011-01-11 ENCOUNTER — Encounter: Payer: Self-pay | Admitting: Internal Medicine

## 2011-01-16 ENCOUNTER — Encounter: Payer: Self-pay | Admitting: Internal Medicine

## 2011-01-16 ENCOUNTER — Ambulatory Visit (INDEPENDENT_AMBULATORY_CARE_PROVIDER_SITE_OTHER): Payer: 59 | Admitting: Internal Medicine

## 2011-01-16 VITALS — BP 104/58 | HR 76 | Temp 98.4°F | Ht 62.0 in | Wt 159.0 lb

## 2011-01-16 DIAGNOSIS — F3289 Other specified depressive episodes: Secondary | ICD-10-CM

## 2011-01-16 DIAGNOSIS — F17200 Nicotine dependence, unspecified, uncomplicated: Secondary | ICD-10-CM

## 2011-01-16 DIAGNOSIS — Z23 Encounter for immunization: Secondary | ICD-10-CM

## 2011-01-16 DIAGNOSIS — Z0000 Encounter for general adult medical examination without abnormal findings: Secondary | ICD-10-CM | POA: Insufficient documentation

## 2011-01-16 DIAGNOSIS — F329 Major depressive disorder, single episode, unspecified: Secondary | ICD-10-CM

## 2011-01-16 LAB — BASIC METABOLIC PANEL
BUN: 11 mg/dL (ref 6–23)
GFR: 97.85 mL/min (ref >60.00)
Glucose, Bld: 90 mg/dL (ref 70–99)
Potassium: 5.2 mEq/L — ABNORMAL HIGH (ref 3.5–5.1)

## 2011-01-16 LAB — CBC WITH DIFFERENTIAL/PLATELET
Basophils Relative: 0.5 % (ref 0.0–3.0)
Hemoglobin: 14.4 g/dL (ref 12.0–15.0)
Lymphs Abs: 3.4 10*3/uL (ref 0.7–4.0)
MCV: 95.4 fl (ref 78.0–100.0)
Monocytes Absolute: 0.5 10*3/uL (ref 0.1–1.0)
Neutrophils Relative %: 50.8 % (ref 43.0–77.0)
Platelets: 344 10*3/uL (ref 150.0–400.0)
RBC: 4.54 Mil/uL (ref 3.87–5.11)

## 2011-01-16 LAB — LIPID PANEL
Total CHOL/HDL Ratio: 4
VLDL: 19 mg/dL (ref 0.0–40.0)

## 2011-01-16 LAB — HEPATIC FUNCTION PANEL
AST: 25 U/L (ref 0–37)
Total Bilirubin: 0.7 mg/dL (ref 0.3–1.2)
Total Protein: 6.4 g/dL (ref 6.0–8.3)

## 2011-01-16 LAB — LDL CHOLESTEROL, DIRECT: Direct LDL: 147 mg/dL

## 2011-01-16 MED ORDER — PAROXETINE HCL 20 MG PO TABS: ORAL_TABLET | ORAL | Status: DC

## 2011-01-16 NOTE — Assessment & Plan Note (Signed)
counselled Not ready now but discussed plan for 3 minutes or so

## 2011-01-16 NOTE — Assessment & Plan Note (Signed)
Doing great Excellent weight loss at bariatric clinic No more sleep apnea Feels better Will go to gyn for eval

## 2011-01-16 NOTE — Progress Notes (Signed)
Subjective:    Patient ID: Julia Mejia, female    DOB: April 06, 1961, 50 y.o.   MRN: [id number]  HPI Has been going to bariatric clinic Phentermine and hcg shots Lost over 70# since last visit  Still smoking  Has cut down to under 1PPD Doesn't smoke at work Discussed using patches and lozenges "I know I can do it"  Mood has been fine "brain jumps", gets fidgety without the paroxetine  Discussed withdrawal  No sig depression  No more snoring Doesn't need the CPAP No apnea per husband  No period in 4 years Plans to see gynecologist Does have hot flashes, etc  Current Outpatient Prescriptions on File Prior to Visit  Medication Sig Dispense Refill  . PARoxetine (PAXIL) 20 MG tablet Take one tablet by mouth at bedtime.  30 tablet  0    Allergies  Allergen Reactions  . Sulfonamide Derivatives     Past Medical History  Diagnosis Date  . Allergy   . Depression   . GERD (gastroesophageal reflux disease)   . Anxiety   . Obesity, unspecified   . Lymphocytic colitis   . Asthma   . OSA (obstructive sleep apnea)     Past Surgical History  Procedure Date  . Vaginal delivery     x2  . Tubal ligation   . Ankle surgery 11/10    right    Family History  Problem Relation Age of Onset  . Diabetes Mother   . Alzheimer's disease Mother   . Osteoporosis Mother   . Diabetes Maternal Aunt   . Cancer Maternal Grandfather     esophageal and colon cancer    History   Social History  . Marital Status: Married    Spouse Name: N/A    Number of Children: 2  . Years of Education: N/A   Occupational History  . 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 Smoking status: Current Everyday Smoker  . Smokeless tobacco: Never Used  . Alcohol Use: Yes  . Drug Use: No  . Sexually Active: Not on file   Other Topics Concern  . Not on file   Social History Narrative   Husband is disabled   Review of Systems  Constitutional: Positive for appetite  change. Negative for fatigue.       Walking regularly Trying to do some light weight work Wears seat belt  HENT: Positive for congestion and rhinorrhea. Negative for hearing loss, dental problem and tinnitus.        Occ mild allergy symptoms---uses loratadine Regular with dentist  Eyes: Negative for visual disturbance.       No diplopia or unilateral vision loss Needs reading glasses  Respiratory: Negative for cough, chest tightness and shortness of breath.   Cardiovascular: Positive for leg swelling. Negative for chest pain and palpitations.       Occ right ankle swelling where past fracture was---better with elevation  Gastrointestinal: Negative for nausea, vomiting, abdominal pain, constipation and blood in stool.       No heartburn  Genitourinary: Negative for dysuria, frequency, difficulty urinating and dyspareunia.  Musculoskeletal: Negative for back pain, joint swelling and arthralgias.  Skin: Negative for rash.       Has spot on upper back that is somewhat sore for 3 months Husband watches lesions on back  Neurological: Negative for dizziness, syncope, weakness, light-headedness, numbness and headaches.  Hematological: Negative for adenopathy. Does not bruise/bleed easily.  Psychiatric/Behavioral: Negative for sleep disturbance  and dysphoric mood. The patient is not nervous/anxious.        Objective:   Physical Exam  Constitutional: She is oriented to person, place, and time. She appears well-developed and well-nourished. No distress.  HENT:  Head: Normocephalic and atraumatic.  Right Ear: External ear normal.  Left Ear: External ear normal.  Mouth/Throat: Oropharynx is clear and moist. No oropharyngeal exudate.       TMs normal  Eyes: Conjunctivae and EOM are normal. Pupils are equal, round, and reactive to light.       Fundi benign  Neck: Normal range of motion. Neck supple. No thyromegaly present.  Cardiovascular: Normal rate, regular rhythm, normal heart sounds and  intact distal pulses.  Exam reveals no gallop.   No murmur heard. Pulmonary/Chest: Effort normal and breath sounds normal. No respiratory distress. She has no wheezes. She has no rales.  Abdominal: Soft. She exhibits no mass. There is no tenderness.  Musculoskeletal: Normal range of motion. She exhibits no edema and no tenderness.  Lymphadenopathy:    She has no cervical adenopathy.  Neurological: She is alert and oriented to person, place, and time. She exhibits normal muscle tone.       Normal strength and gait  Skin: Skin is warm. No rash noted.       Benign nevi on back Small seb keratosis is the irritating spot  Psychiatric: She has a normal mood and affect. Her behavior is normal. Judgment and thought content normal.          Assessment & Plan:

## 2011-01-16 NOTE — Assessment & Plan Note (Signed)
Controlled on the sertraline Does have some withdrawal if misses dose If stable next year, and has stopped smoking, will consider weaning the sertraline----continue for now

## 2011-01-22 LAB — CBC
HCT: 40.3
MCHC: 34.3
MCV: 93.8
RBC: 4.29
RDW: 13.1
WBC: 11.6 — ABNORMAL HIGH

## 2012-02-05 ENCOUNTER — Other Ambulatory Visit: Payer: Self-pay | Admitting: Internal Medicine

## 2012-04-03 ENCOUNTER — Other Ambulatory Visit: Payer: Self-pay | Admitting: Internal Medicine

## 2012-04-03 ENCOUNTER — Other Ambulatory Visit: Payer: Self-pay

## 2012-04-03 NOTE — Telephone Encounter (Signed)
Pt request paxil with 90 day quantity rather than 30 day. 02/05/12 # 90 paxil sent to [facility]. Pt said she only got # 30. [facility] rx sent in # 90 but divided dose due to insurance or pts request at 30/30/30. Pt said she would call for appt before med runs out.pt voiced understanding.

## 2012-04-08 ENCOUNTER — Encounter: Payer: Self-pay | Admitting: Internal Medicine

## 2012-04-08 ENCOUNTER — Ambulatory Visit (INDEPENDENT_AMBULATORY_CARE_PROVIDER_SITE_OTHER): Payer: 59 | Admitting: Internal Medicine

## 2012-04-08 VITALS — BP 122/70 | HR 81 | Temp 98.4°F | Ht 62.0 in | Wt 190.0 lb

## 2012-04-08 DIAGNOSIS — Z23 Encounter for immunization: Secondary | ICD-10-CM

## 2012-04-08 DIAGNOSIS — Z0000 Encounter for general adult medical examination without abnormal findings: Secondary | ICD-10-CM

## 2012-04-08 DIAGNOSIS — F341 Dysthymic disorder: Secondary | ICD-10-CM

## 2012-04-08 DIAGNOSIS — J309 Allergic rhinitis, unspecified: Secondary | ICD-10-CM

## 2012-04-08 LAB — GLUCOSE, RANDOM: Glucose, Bld: 97 mg/dL (ref 70–99)

## 2012-04-08 NOTE — Progress Notes (Signed)
Subjective:    Patient ID: Julia Mejia, female    DOB: 09/12/1960, 51 y.o.   MRN: [id number]  HPI Here with physical  Stopped smoking and gained back a lot of weight Will be getting [employer] and starting regular exercise  Still on the paroxetine This keeps her mood stable Gets "brain jumping" if she goes to every other day  Still sees gynecologist Last visit about a year ago and will go back soon  Current Outpatient Prescriptions on File Prior to Visit  Medication Sig Dispense Refill  . PARoxetine (PAXIL) 20 MG tablet TAKE ONE TABLET BY MOUTH AT BEDTIME  90 tablet  0    Allergies  Allergen Reactions  . Sulfonamide Derivatives     Past Medical History  Diagnosis Date  . Allergy   . Depression   . GERD (gastroesophageal reflux disease)   . Anxiety   . Obesity, unspecified   . Lymphocytic colitis   . Asthma   . OSA (obstructive sleep apnea)     Past Surgical History  Procedure Date  . Vaginal delivery     x2  . Tubal ligation   . Ankle surgery 11/10    right    Family History  Problem Relation Age of Onset  . Diabetes Mother   . Alzheimer's disease Mother   . Osteoporosis Mother   . Diabetes Maternal Aunt   . Cancer Maternal Grandfather     esophageal and colon cancer    History   Social History  . Marital Status: Married    Spouse Name: N/A    Number of Children: 2  . Years of Education: N/A   Occupational History  . 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 Smoking status: Former Smoker    Types: Cigarettes    Quit date: 04/22/2010  . Smokeless tobacco: Never Used  . Alcohol Use: Yes  . Drug Use: No  . Sexually Active: Not on file   Other Topics Concern  . Not on file   Social History Narrative   Husband is disabled   Review of Systems  Constitutional: Positive for unexpected weight change. Negative for fatigue.       Wears seat belt   HENT: Positive for congestion and rhinorrhea. Negative for  hearing loss, dental problem and tinnitus.        Regular with dentist Occ allergies with pollen--uses OTC loratadine  Eyes: Negative for visual disturbance.       No diplopia or unilateral vision loss  Respiratory: Positive for wheezing. Negative for cough, chest tightness and shortness of breath.        Still has occ wheezing with a cold---doesn't use inhaler   Cardiovascular: Negative for chest pain, palpitations and leg swelling.  Gastrointestinal: Negative for nausea, vomiting, abdominal pain, constipation and blood in stool.       Still no heartburn  Genitourinary: Negative for urgency, frequency, difficulty urinating and dyspareunia.       No libido but no sex problems No incontinence  Musculoskeletal: Negative for back pain, joint swelling and arthralgias.  Skin: Negative for rash.       No suspicious lesions  Neurological: Negative for dizziness, syncope, weakness, light-headedness, numbness and headaches.  Hematological: Negative for adenopathy. Does not bruise/bleed easily.  Psychiatric/Behavioral: Negative for sleep disturbance and dysphoric mood. The patient is not nervous/anxious.        Objective:   Physical Exam  Constitutional: She is oriented to person,  place, and time. She appears well-developed and well-nourished. No distress.  HENT:  Head: Normocephalic and atraumatic.  Right Ear: External ear normal.  Left Ear: External ear normal.  Mouth/Throat: Oropharynx is clear and moist. No oropharyngeal exudate.  Eyes: Conjunctivae normal and EOM are normal. Pupils are equal, round, and reactive to light.  Neck: Normal range of motion. Neck supple. No thyromegaly present.  Cardiovascular: Normal rate, regular rhythm, normal heart sounds and intact distal pulses.  Exam reveals no gallop.   No murmur heard. Pulmonary/Chest: Effort normal and breath sounds normal. No respiratory distress. She has no wheezes. She has no rales.  Abdominal: Soft. There is no tenderness.   Musculoskeletal: Normal range of motion. She exhibits no edema and no tenderness.  Lymphadenopathy:    She has no cervical adenopathy.  Neurological: She is alert and oriented to person, place, and time.  Skin: No rash noted. No erythema.       Lots of benign nevi including 1 about 8-42mm diameter on upper back  Psychiatric: She has a normal mood and affect. Her behavior is normal.          Assessment & Plan:

## 2012-04-08 NOTE — Assessment & Plan Note (Signed)
Improved Will try weaning---sounds like she has had withdrawal phenomenon so not clear if she still needs this

## 2012-04-08 NOTE — Patient Instructions (Signed)
Please decrease the paroxetine to 20mg  on even days and 10mg  on odd days for 2 months.  If you are still doing well then, decrease to 10mg  daily If you do well at this dose, you can call for 10mg  tabs and you can try to decrease the same way but cutting them in half every other day

## 2012-04-08 NOTE — Assessment & Plan Note (Signed)
Okay with OTC loratadine

## 2012-04-08 NOTE — Assessment & Plan Note (Signed)
Generally healthy Has gained some weight back but stopped smoking [facility] Will just [facility] glucose Going to gyn

## 2012-05-06 ENCOUNTER — Other Ambulatory Visit: Payer: Self-pay

## 2012-05-06 MED ORDER — PAROXETINE HCL 20 MG PO TABS: 20.0000 mg | ORAL_TABLET | Freq: Every day | ORAL | Status: DC

## 2012-05-06 NOTE — Telephone Encounter (Signed)
Okay to refill as she requests Not clear we will be able to keep her on decreased dose

## 2012-05-06 NOTE — Telephone Encounter (Signed)
Pt request refill paroxetine 20 mg taking one tab by mouth at bedtime with quantity # 90 and 3 additional refills. Pt was seen on 04/08/12 has started taking Paroxetine 20 mg on even days and 10 mg on odd days for a 2 month period. Pt said she is doing well decreasing med but does not want prescription directions changed from once at hs and wants years refill in case she needs it.Please advise.

## 2012-05-06 NOTE — Telephone Encounter (Signed)
rx sent to pharmacy by e-script Left message that rx was sent to pharmacy

## 2012-11-04 ENCOUNTER — Encounter: Payer: Self-pay | Admitting: Internal Medicine

## 2013-02-25 ENCOUNTER — Other Ambulatory Visit: Payer: Self-pay

## 2013-05-06 ENCOUNTER — Encounter: Payer: Self-pay | Admitting: Internal Medicine

## 2013-05-06 MED ORDER — PAROXETINE HCL 20 MG PO TABS: 20.0000 mg | ORAL_TABLET | Freq: Every day | ORAL | Status: DC

## 2013-05-07 ENCOUNTER — Ambulatory Visit: Payer: 59 | Admitting: Internal Medicine

## 2013-10-15 ENCOUNTER — Ambulatory Visit (INDEPENDENT_AMBULATORY_CARE_PROVIDER_SITE_OTHER): Payer: PRIVATE HEALTH INSURANCE | Admitting: Internal Medicine

## 2013-10-15 ENCOUNTER — Other Ambulatory Visit (HOSPITAL_COMMUNITY)
Admission: RE | Admit: 2013-10-15 | Discharge: 2013-10-15 | Disposition: A | Discharge status: Home / Self Care | Payer: PRIVATE HEALTH INSURANCE | Source: Ambulatory Visit | Attending: Internal Medicine | Admitting: Internal Medicine

## 2013-10-15 ENCOUNTER — Encounter: Payer: Self-pay | Admitting: Internal Medicine

## 2013-10-15 VITALS — BP 130/80 | HR 82 | Temp 98.2°F | Ht 63.0 in | Wt 218.0 lb

## 2013-10-15 DIAGNOSIS — Z01419 Encounter for gynecological examination (general) (routine) without abnormal findings: Secondary | ICD-10-CM | POA: Insufficient documentation

## 2013-10-15 DIAGNOSIS — Z0000 Encounter for general adult medical examination without abnormal findings: Secondary | ICD-10-CM

## 2013-10-15 DIAGNOSIS — F39 Unspecified mood [affective] disorder: Secondary | ICD-10-CM

## 2013-10-15 DIAGNOSIS — Z86010 Personal history of colonic polyps: Secondary | ICD-10-CM

## 2013-10-15 DIAGNOSIS — N952 Postmenopausal atrophic vaginitis: Secondary | ICD-10-CM

## 2013-10-15 DIAGNOSIS — Z23 Encounter for immunization: Secondary | ICD-10-CM

## 2013-10-15 DIAGNOSIS — K219 Gastro-esophageal reflux disease without esophagitis: Secondary | ICD-10-CM

## 2013-10-15 DIAGNOSIS — Z1151 Encounter for screening for human papillomavirus (HPV): Secondary | ICD-10-CM | POA: Insufficient documentation

## 2013-10-15 LAB — CBC WITH DIFFERENTIAL/PLATELET
BASOS PCT: 0.5 % (ref 0.0–3.0)
Basophils Absolute: 0.1 10*3/uL (ref 0.0–0.1)
Eosinophils Absolute: 0.3 10*3/uL (ref 0.0–0.7)
Eosinophils Relative: 1.9 % (ref 0.0–5.0)
HCT: 43.8 % (ref 36.0–46.0)
Hemoglobin: 14.7 g/dL (ref 12.0–15.0)
LYMPHS PCT: 35.7 % (ref 12.0–46.0)
Lymphs Abs: 4.9 10*3/uL — ABNORMAL HIGH (ref 0.7–4.0)
MCHC: 33.5 g/dL (ref 30.0–36.0)
MCV: 94.1 fl (ref 78.0–100.0)
MONO ABS: 1 10*3/uL (ref 0.1–1.0)
Monocytes Relative: 7.2 % (ref 3.0–12.0)
NEUTROS PCT: 54.7 % (ref 43.0–77.0)
Neutro Abs: 7.5 10*3/uL (ref 1.4–7.7)
PLATELETS: 378 10*3/uL (ref 150.0–400.0)
RBC: 4.66 Mil/uL (ref 3.87–5.11)
RDW: 13.7 % (ref 11.5–15.5)
WBC: 13.7 10*3/uL — ABNORMAL HIGH (ref 4.0–10.5)

## 2013-10-15 LAB — COMPREHENSIVE METABOLIC PANEL
ALBUMIN: 4.4 g/dL (ref 3.5–5.2)
ALK PHOS: 89 U/L (ref 39–117)
ALT: 22 U/L (ref 0–35)
AST: 22 U/L (ref 0–37)
BUN: 10 mg/dL (ref 6–23)
CALCIUM: 9.5 mg/dL (ref 8.4–10.5)
CHLORIDE: 102 meq/L (ref 96–112)
CO2: 27 mEq/L (ref 19–32)
Creatinine, Ser: 0.7 mg/dL (ref 0.4–1.2)
GFR: 96.76 mL/min (ref >60.00)
GLUCOSE: 92 mg/dL (ref 70–99)
POTASSIUM: 3.9 meq/L (ref 3.5–5.1)
SODIUM: 138 meq/L (ref 135–145)
TOTAL PROTEIN: 7.7 g/dL (ref 6.0–8.3)
Total Bilirubin: 0.4 mg/dL (ref 0.2–1.2)

## 2013-10-15 LAB — T4, FREE: Free T4: 0.91 ng/dL (ref 0.60–1.60)

## 2013-10-15 LAB — TSH: TSH: 0.23 u[IU]/mL — ABNORMAL LOW (ref 0.35–4.50)

## 2013-10-15 MED ORDER — ESTRADIOL 0.1 MG/GM VA CREA: 1.0000 | TOPICAL_CREAM | VAGINAL | Status: DC

## 2013-10-15 MED ORDER — PAROXETINE HCL 20 MG PO TABS: 20.0000 mg | ORAL_TABLET | Freq: Every day | ORAL | Status: DC

## 2013-10-15 NOTE — Progress Notes (Signed)
Pre visit review using our clinic review tool, if applicable. No additional management support is needed unless otherwise documented below in the visit note. 

## 2013-10-15 NOTE — Addendum Note (Signed)
Addended by: Despina Hidden on: 10/15/2013 12:56 PM   Modules accepted: Orders

## 2013-10-15 NOTE — Assessment & Plan Note (Signed)
Healthy but needs to work on fitness and eating better Advised to call quit line since started smoking again Due for mammogram Will contact Dr Rene Paci think she needs another colonoscopy

## 2013-10-15 NOTE — Assessment & Plan Note (Signed)
Okay with the med 

## 2013-10-15 NOTE — Patient Instructions (Addendum)
Please call 1-800 QUIT NOW for advice about stopping smoking. Please set up your screening mammogram.  DASH Eating Plan DASH stands for "Dietary Approaches to Stop Hypertension." The DASH eating plan is a healthy eating plan that has been shown to reduce high blood pressure (hypertension). Additional health benefits may include reducing the risk of type 2 diabetes mellitus, heart disease, and stroke. The DASH eating plan may also help with weight loss. WHAT DO I NEED TO KNOW ABOUT THE DASH EATING PLAN? For the DASH eating plan, you will follow these general guidelines:  Choose foods with a percent daily value for sodium of less than 5% (as listed on the food label).  Use salt-free seasonings or herbs instead of table salt or sea salt.  Check with your health care Denita Lun or pharmacist before using salt substitutes.  Eat lower-sodium products, often labeled as "lower sodium" or "no salt added."  Eat fresh foods.  Eat more vegetables, fruits, and low-fat dairy products.  Choose whole grains. Look for the word "whole" as the first word in the ingredient list.  Choose fish and skinless chicken or [place] more often than red meat. Limit fish, poultry, and meat to 6 oz (170 g) each day.  Limit sweets, desserts, sugars, and sugary drinks.  Choose heart-healthy fats.  Limit cheese to 1 oz (28 g) per day.  Eat more home-cooked food and less restaurant, buffet, and fast food.  Limit fried foods.  Cook foods using methods other than frying.  Limit canned vegetables. If you do use them, rinse them well to decrease the sodium.  When eating at a restaurant, ask that your food be prepared with less salt, or no salt if possible. WHAT FOODS CAN I EAT? Seek help from a dietitian for individual calorie needs. Grains Whole grain or whole wheat bread. Brown rice. Whole grain or whole wheat pasta. Quinoa, bulgur, and whole grain cereals. Low-sodium cereals. Corn or whole wheat flour tortillas.  Whole grain cornbread. Whole grain crackers. Low-sodium crackers. Vegetables Fresh or frozen vegetables (raw, steamed, roasted, or grilled). Low-sodium or reduced-sodium tomato and vegetable juices. Low-sodium or reduced-sodium tomato sauce and paste. Low-sodium or reduced-sodium canned vegetables.  Fruits All fresh, canned (in natural juice), or frozen fruits. Meat and Other Protein Products Ground beef (85% or leaner), grass-fed beef, or beef trimmed of fat. Skinless chicken or [place]. Ground chicken or [place]. Pork trimmed of fat. All fish and seafood. Eggs. Dried beans, peas, or lentils. Unsalted nuts and seeds. Unsalted canned beans. Dairy Low-fat dairy products, such as skim or 1% milk, 2% or reduced-fat cheeses, low-fat ricotta or cottage cheese, or plain low-fat yogurt. Low-sodium or reduced-sodium cheeses. Fats and Oils Tub margarines without trans fats. Light or reduced-fat mayonnaise and salad dressings (reduced sodium). Avocado. Safflower, olive, or canola oils. Natural peanut or almond butter. Other Unsalted popcorn and pretzels. The items listed above may not be a complete list of recommended foods or beverages. Contact your dietitian for more options. WHAT FOODS ARE NOT RECOMMENDED? Grains White bread. White pasta. White rice. Refined cornbread. Bagels and croissants. Crackers that contain trans fat. Vegetables Creamed or fried vegetables. Vegetables in a cheese sauce. Regular canned vegetables. Regular canned tomato sauce and paste. Regular tomato and vegetable juices. Fruits Dried fruits. Canned fruit in light or heavy syrup. Fruit juice. Meat and Other Protein Products Fatty cuts of meat. Ribs, chicken wings, bacon, sausage, bologna, salami, chitterlings, fatback, hot dogs, bratwurst, and packaged luncheon meats. Salted nuts and seeds. Canned beans  with salt. Dairy Whole or 2% milk, cream, half-and-half, and cream cheese. Whole-fat or sweetened yogurt. Full-fat cheeses or  blue cheese. Nondairy creamers and whipped toppings. Processed cheese, cheese spreads, or cheese curds. Condiments Onion and garlic salt, seasoned salt, table salt, and sea salt. Canned and packaged gravies. Worcestershire sauce. Tartar sauce. Barbecue sauce. Teriyaki sauce. Soy sauce, including reduced sodium. Steak sauce. Fish sauce. Oyster sauce. Cocktail sauce. Horseradish. Ketchup and mustard. Meat flavorings and tenderizers. Bouillon cubes. Hot sauce. Tabasco sauce. Marinades. Taco seasonings. Relishes. Fats and Oils Butter, stick margarine, lard, shortening, ghee, and bacon fat. Coconut, palm kernel, or palm oils. Regular salad dressings. Other Pickles and olives. Salted popcorn and pretzels. The items listed above may not be a complete list of foods and beverages to avoid. Contact your dietitian for more information. WHERE CAN I FIND MORE INFORMATION? National Heart, Lung, and Blood Institute: travelstabloid.com Document Released: 03/28/2011 Document Revised: 04/13/2013 Document Reviewed: 02/10/2013 [facility] Patient Information 2015 [place], [place]. This information is not intended to replace advice given to you by your health care Krishang Reading. Make sure you discuss any questions you have with your health care Tinisha Etzkorn.

## 2013-10-15 NOTE — Assessment & Plan Note (Signed)
Has not done well with paxil wean Will continue

## 2013-10-15 NOTE — Assessment & Plan Note (Signed)
With sig dyspareunia Will try estrogen cream

## 2013-10-15 NOTE — Progress Notes (Signed)
Subjective:    Patient ID: Julia Mejia, female    DOB: 1960-05-27, 53 y.o.   MRN: [id number]  HPI Here for physical  Having pain with sex for the past 6 months Uses lubrication Last period 4-5 years ago No bleeding No longer seeing gyn  Continues on the paxil Tried to wean off but didn't do well Even with only 1/2-- gets "brain flipping" Gets "bitchy" when not on it---will get ill quickly  Heartburn controlled Takes OTC omeprazole daily  Asthma and allergies have been quiet  Went back to smoking due to the weight gain Counseled and asked her to call quit line  Current Outpatient Prescriptions on File Prior to Visit  Medication Sig Dispense Refill  . PARoxetine (PAXIL) 20 MG tablet Take 1 tablet (20 mg total) by mouth at bedtime.  90 tablet  1   No current facility-administered medications on file prior to visit.    Allergies  Allergen Reactions  . Sulfonamide Derivatives     Past Medical History  Diagnosis Date  . Allergy   . Depression   . GERD (gastroesophageal reflux disease)   . Anxiety   . Obesity, unspecified   . Lymphocytic colitis   . Asthma   . OSA (obstructive sleep apnea)     Past Surgical History  Procedure Laterality Date  . Vaginal delivery      x2  . Tubal ligation    . Ankle surgery  11/10    right    Family History  Problem Relation Age of Onset  . Diabetes Mother   . Alzheimer's disease Mother   . Osteoporosis Mother   . Diabetes Maternal Aunt   . Cancer Maternal Grandfather     esophageal and colon cancer    History   Social History  . Marital Status: Married    Spouse Name: N/A    Number of Children: 2  . Years of Education: N/A   Occupational History  . 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 Smoking status: Current Every Day Smoker    Types: Cigarettes  . Smokeless tobacco: Never Used  . Alcohol Use: Yes  . Drug Use: No  . Sexual Activity: Not on file   Other Topics Concern  .  Not on file   Social History Narrative   Husband is disabled   Review of Systems  Constitutional: Positive for unexpected weight change. Negative for fatigue.       Wears seat belt  HENT: Negative for dental problem, hearing loss and tinnitus.        Regular with dentist  Eyes: Negative for visual disturbance.       No diplopia or unilateral vision loss. Needs OTC reading glasses  Respiratory: Negative for cough, chest tightness and shortness of breath.   Cardiovascular: Negative for chest pain, palpitations and leg swelling.  Gastrointestinal: Negative for nausea, vomiting, abdominal pain, constipation and blood in stool.       Heartburn controlled on med  Endocrine: Negative for cold intolerance and heat intolerance.  Genitourinary: Positive for dyspareunia. Negative for dysuria, hematuria and difficulty urinating.  Musculoskeletal: Negative for arthralgias, back pain and joint swelling.  Skin: Negative for rash.       Had mole on back that fell off after rubbing on shirt  Allergic/Immunologic: Positive for environmental allergies. Negative for immunocompromised state.       Uses cetirizine prn  Neurological: Positive for headaches. Negative for dizziness, syncope, weakness,  light-headedness and numbness.       Occ sinus headaches  Hematological: Negative for adenopathy. Does not bruise/bleed easily.  Psychiatric/Behavioral: Negative for sleep disturbance and dysphoric mood.       Objective:   Physical Exam  Constitutional: She is oriented to person, place, and time. She appears well-developed and well-nourished. No distress.  HENT:  Head: Normocephalic and atraumatic.  Right Ear: External ear normal.  Left Ear: External ear normal.  Mouth/Throat: Oropharynx is clear and moist. No oropharyngeal exudate.  Eyes: Conjunctivae and EOM are normal. Pupils are equal, round, and reactive to light.  Neck: Normal range of motion. Neck supple. No thyromegaly present.  Cardiovascular:  Normal rate, regular rhythm, normal heart sounds and intact distal pulses.  Exam reveals no gallop.   No murmur heard. Pulmonary/Chest: Effort normal and breath sounds normal. No respiratory distress. She has no wheezes. She has no rales.  Abdominal: Soft. There is no tenderness. There is no rebound and no guarding.  Genitourinary:  No breast masses Vaginal atrophy. No perineal lesions Poor cervical visibility but pap done Limited bimanual negative  Musculoskeletal: She exhibits no edema and no tenderness.  Lymphadenopathy:    She has no cervical adenopathy.  Neurological: She is alert and oriented to person, place, and time.  Skin:  seb keratosis and benign nevi  Psychiatric: She has a normal mood and affect. Her behavior is normal.          Assessment & Plan:

## 2013-10-16 NOTE — Addendum Note (Signed)
Addended by: Viviana Simpler I on: 10/16/2013 02:38 PM   Modules accepted: Orders

## 2013-10-18 ENCOUNTER — Encounter: Payer: Self-pay | Admitting: Internal Medicine

## 2013-10-18 LAB — CYTOLOGY - PAP

## 2014-01-04 ENCOUNTER — Encounter: Payer: PRIVATE HEALTH INSURANCE | Admitting: Internal Medicine

## 2014-02-04 ENCOUNTER — Other Ambulatory Visit: Payer: Self-pay

## 2014-10-18 ENCOUNTER — Encounter: Payer: Self-pay | Admitting: Internal Medicine

## 2014-10-18 ENCOUNTER — Ambulatory Visit (INDEPENDENT_AMBULATORY_CARE_PROVIDER_SITE_OTHER): Payer: BLUE CROSS/BLUE SHIELD | Admitting: Internal Medicine

## 2014-10-18 VITALS — BP 140/90 | HR 87 | Temp 98.4°F | Ht 63.0 in | Wt 230.0 lb

## 2014-10-18 DIAGNOSIS — K219 Gastro-esophageal reflux disease without esophagitis: Secondary | ICD-10-CM | POA: Diagnosis not present

## 2014-10-18 DIAGNOSIS — F39 Unspecified mood [affective] disorder: Secondary | ICD-10-CM | POA: Diagnosis not present

## 2014-10-18 DIAGNOSIS — Z0000 Encounter for general adult medical examination without abnormal findings: Secondary | ICD-10-CM

## 2014-10-18 MED ORDER — PAROXETINE HCL 20 MG PO TABS: 20.0000 mg | ORAL_TABLET | Freq: Every day | ORAL | Status: DC

## 2014-10-18 NOTE — Assessment & Plan Note (Signed)
Controlled with PPI

## 2014-10-18 NOTE — Progress Notes (Signed)
Pre visit review using our clinic review tool, if applicable. No additional management support is needed unless otherwise documented below in the visit note. 

## 2014-10-18 NOTE — Progress Notes (Signed)
Subjective:    Patient ID: Julia Mejia, female    DOB: 1960/12/22, 54 y.o.   MRN: [id number]  HPI Here for physical Still smoking-- but only 4 per week Discussed stopping for good again  Gained 12# back again Now regained all her weight Feels sluggish Stress eating---son in and out of prison, etc Job will only last another 1-2 years--- not sure what will happen after that  Still on paxil Satisfied with continuing  This helps keep her level  No problems with PPI Controls her symptoms well  Current Outpatient Prescriptions on File Prior to Visit  Medication Sig Dispense Refill  . cetirizine (ZYRTEC) 10 MG tablet Take 10 mg by mouth daily as needed for allergies.    Marland Kitchen omeprazole (PRILOSEC) 20 MG capsule Take 20 mg by mouth daily.     No current facility-administered medications on file prior to visit.    Allergies  Allergen Reactions  . Sulfonamide Derivatives     Past Medical History  Diagnosis Date  . Allergy   . Depression   . GERD (gastroesophageal reflux disease)   . Anxiety   . Obesity, unspecified   . Lymphocytic colitis   . Asthma   . OSA (obstructive sleep apnea)     Past Surgical History  Procedure Laterality Date  . Vaginal delivery      x2  . Tubal ligation    . Ankle surgery  11/10    right    Family History  Problem Relation Age of Onset  . Diabetes Mother   . Alzheimer's disease Mother   . Osteoporosis Mother   . Diabetes Maternal Aunt   . Cancer Maternal Grandfather     esophageal and colon cancer    History   Social History  . Marital Status: Married    Spouse Name: N/A  . Number of Children: 2  . Years of Education: N/A   Occupational History  . 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 Smoking status: Current Some Day Smoker    Types: Cigarettes  . Smokeless tobacco: Never Used  . Alcohol Use: 0.0 oz/week    0 Standard drinks or equivalent per week  . Drug Use: No  . Sexual Activity: Not  on file   Other Topics Concern  . Not on file   Social History Narrative   Husband is disabled   Review of Systems  Constitutional: Positive for fatigue and unexpected weight change.       Wears seat belt  HENT: Negative for dental problem and tinnitus.        Keeps up with dentist  Eyes: Negative for visual disturbance.       No diplopia or unilateral vision loss  Respiratory: Negative for cough, chest tightness and shortness of breath.   Cardiovascular: Negative for chest pain, palpitations and leg swelling.  Gastrointestinal: Negative for nausea, vomiting, abdominal pain, constipation and blood in stool.  Endocrine: Negative for polydipsia and polyuria.  Genitourinary: Negative for dysuria, frequency and hematuria.       No sex--no problem  Musculoskeletal: Positive for arthralgias. Negative for back pain and joint swelling.       Slight ankle pain at site of past fracture  Skin: Negative for rash.  Allergic/Immunologic: Positive for environmental allergies. Negative for immunocompromised state.       Coughs up thick mucus in AM Uses cetirizine regularly  Neurological: Positive for headaches. Negative for dizziness, syncope, weakness, light-headedness and  numbness.  Psychiatric/Behavioral: Positive for sleep disturbance. The patient is nervous/anxious.        Still snores Didn't tolerate CPAP Some jerking but no known apnea Awakens refreshed and without excessive daytime somnolence       Objective:   Physical Exam  Constitutional: She is oriented to person, place, and time. She appears well-developed and well-nourished. No distress.  HENT:  Head: Normocephalic and atraumatic.  Right Ear: External ear normal.  Left Ear: External ear normal.  Mouth/Throat: Oropharynx is clear and moist. No oropharyngeal exudate.  Eyes: Conjunctivae and EOM are normal. Pupils are equal, round, and reactive to light.  Neck: Normal range of motion. Neck supple. No thyromegaly present.    Cardiovascular: Normal rate, regular rhythm, normal heart sounds and intact distal pulses.  Exam reveals no gallop.   No murmur heard. Pulmonary/Chest: Effort normal and breath sounds normal. No respiratory distress. She has no wheezes. She has no rales.  Abdominal: Soft. There is no tenderness.  Musculoskeletal: She exhibits no edema or tenderness.  Lymphadenopathy:    She has no cervical adenopathy.  Neurological: She is alert and oriented to person, place, and time.  Skin: No rash noted. No erythema.  Psychiatric: She has a normal mood and affect. Her behavior is normal.          Assessment & Plan:

## 2014-10-18 NOTE — Assessment & Plan Note (Addendum)
Healthy but poor fitness Discussed stopping cigarettes and sweetened drinks Needs colonoscopy Will defer mammo due to financial concerns (can't do everything) Will defer blood work this year

## 2014-10-18 NOTE — Patient Instructions (Addendum)
Please set up your repeat colonoscopy with Dr Henrene Pastor as soon as possible. Give up the last of the cigarettes and all sweetened drinks.  DASH Eating Plan DASH stands for "Dietary Approaches to Stop Hypertension." The DASH eating plan is a healthy eating plan that has been shown to reduce high blood pressure (hypertension). Additional health benefits may include reducing the risk of type 2 diabetes mellitus, heart disease, and stroke. The DASH eating plan may also help with weight loss. WHAT DO I NEED TO KNOW ABOUT THE DASH EATING PLAN? For the DASH eating plan, you will follow these general guidelines:  Choose foods with a percent daily value for sodium of less than 5% (as listed on the food label).  Use salt-free seasonings or herbs instead of table salt or sea salt.  Check with your health care provider or pharmacist before using salt substitutes.  Eat lower-sodium products, often labeled as "lower sodium" or "no salt added."  Eat fresh foods.  Eat more vegetables, fruits, and low-fat dairy products.  Choose whole grains. Look for the word "whole" as the first word in the ingredient list.  Choose fish and skinless chicken or [place] more often than red meat. Limit fish, poultry, and meat to 6 oz (170 g) each day.  Limit sweets, desserts, sugars, and sugary drinks.  Choose heart-healthy fats.  Limit cheese to 1 oz (28 g) per day.  Eat more home-cooked food and less restaurant, buffet, and fast food.  Limit fried foods.  Cook foods using methods other than frying.  Limit canned vegetables. If you do use them, rinse them well to decrease the sodium.  When eating at a restaurant, ask that your food be prepared with less salt, or no salt if possible. WHAT FOODS CAN I EAT? Seek help from a dietitian for individual calorie needs. Grains Whole grain or whole wheat bread. Brown rice. Whole grain or whole wheat pasta. Quinoa, bulgur, and whole grain cereals. Low-sodium cereals. Corn or  whole wheat flour tortillas. Whole grain cornbread. Whole grain crackers. Low-sodium crackers. Vegetables Fresh or frozen vegetables (raw, steamed, roasted, or grilled). Low-sodium or reduced-sodium tomato and vegetable juices. Low-sodium or reduced-sodium tomato sauce and paste. Low-sodium or reduced-sodium canned vegetables.  Fruits All fresh, canned (in natural juice), or frozen fruits. Meat and Other Protein Products Ground beef (85% or leaner), grass-fed beef, or beef trimmed of fat. Skinless chicken or [place]. Ground chicken or [place]. Pork trimmed of fat. All fish and seafood. Eggs. Dried beans, peas, or lentils. Unsalted nuts and seeds. Unsalted canned beans. Dairy Low-fat dairy products, such as skim or 1% milk, 2% or reduced-fat cheeses, low-fat ricotta or cottage cheese, or plain low-fat yogurt. Low-sodium or reduced-sodium cheeses. Fats and Oils Tub margarines without trans fats. Light or reduced-fat mayonnaise and salad dressings (reduced sodium). Avocado. Safflower, olive, or canola oils. Natural peanut or almond butter. Other Unsalted popcorn and pretzels. The items listed above may not be a complete list of recommended foods or beverages. Contact your dietitian for more options. WHAT FOODS ARE NOT RECOMMENDED? Grains White bread. White pasta. White rice. Refined cornbread. Bagels and croissants. Crackers that contain trans fat. Vegetables Creamed or fried vegetables. Vegetables in a cheese sauce. Regular canned vegetables. Regular canned tomato sauce and paste. Regular tomato and vegetable juices. Fruits Dried fruits. Canned fruit in light or heavy syrup. Fruit juice. Meat and Other Protein Products Fatty cuts of meat. Ribs, chicken wings, bacon, sausage, bologna, salami, chitterlings, fatback, hot dogs, bratwurst, and packaged  luncheon meats. Salted nuts and seeds. Canned beans with salt. Dairy Whole or 2% milk, cream, half-and-half, and cream cheese. Whole-fat or sweetened  yogurt. Full-fat cheeses or blue cheese. Nondairy creamers and whipped toppings. Processed cheese, cheese spreads, or cheese curds. Condiments Onion and garlic salt, seasoned salt, table salt, and sea salt. Canned and packaged gravies. Worcestershire sauce. Tartar sauce. Barbecue sauce. Teriyaki sauce. Soy sauce, including reduced sodium. Steak sauce. Fish sauce. Oyster sauce. Cocktail sauce. Horseradish. Ketchup and mustard. Meat flavorings and tenderizers. Bouillon cubes. Hot sauce. Tabasco sauce. Marinades. Taco seasonings. Relishes. Fats and Oils Butter, stick margarine, lard, shortening, ghee, and bacon fat. Coconut, palm kernel, or palm oils. Regular salad dressings. Other Pickles and olives. Salted popcorn and pretzels. The items listed above may not be a complete list of foods and beverages to avoid. Contact your dietitian for more information. WHERE CAN I FIND MORE INFORMATION? National Heart, Lung, and Blood Institute: travelstabloid.com Document Released: 03/28/2011 Document Revised: 08/23/2013 Document Reviewed: 02/10/2013 [facility] Patient Information 2015 [place], [place]. This information is not intended to replace advice given to you by your health care provider. Make sure you discuss any questions you have with your health care provider.

## 2014-10-18 NOTE — Assessment & Plan Note (Signed)
Does better staying on the paroxetine

## 2014-12-13 ENCOUNTER — Ambulatory Visit (AMBULATORY_SURGERY_CENTER): Payer: Self-pay

## 2014-12-13 ENCOUNTER — Encounter: Payer: Self-pay | Admitting: Internal Medicine

## 2014-12-13 VITALS — Ht 63.0 in | Wt 233.8 lb

## 2014-12-13 DIAGNOSIS — Z800 Family history of malignant neoplasm of digestive organs: Secondary | ICD-10-CM

## 2014-12-13 MED ORDER — MOVIPREP 100 G PO SOLR: ORAL | Status: DC

## 2014-12-13 NOTE — Progress Notes (Signed)
Per pt, no allergies to soy or egg products.Pt not taking any weight loss meds or using  O2 at home. 

## 2014-12-20 ENCOUNTER — Telehealth: Payer: Self-pay

## 2014-12-20 NOTE — Telephone Encounter (Signed)
I called and spoke with patient to remind her of being due for a mammogram. Patient states that she is having a colonoscopy done on Monday, and that she wants to wait and see how that goes before scheduling a mammogram.

## 2014-12-27 ENCOUNTER — Encounter: Payer: Self-pay | Admitting: Internal Medicine

## 2014-12-27 ENCOUNTER — Ambulatory Visit (AMBULATORY_SURGERY_CENTER): Payer: BLUE CROSS/BLUE SHIELD | Admitting: Internal Medicine

## 2014-12-27 VITALS — BP 158/88 | HR 69 | Temp 98.1°F | Resp 20 | Ht 63.0 in | Wt 233.0 lb

## 2014-12-27 DIAGNOSIS — D125 Benign neoplasm of sigmoid colon: Secondary | ICD-10-CM | POA: Diagnosis not present

## 2014-12-27 DIAGNOSIS — K635 Polyp of colon: Secondary | ICD-10-CM | POA: Diagnosis not present

## 2014-12-27 DIAGNOSIS — Z86010 Personal history of colonic polyps: Secondary | ICD-10-CM | POA: Diagnosis not present

## 2014-12-27 DIAGNOSIS — D122 Benign neoplasm of ascending colon: Secondary | ICD-10-CM | POA: Diagnosis not present

## 2014-12-27 DIAGNOSIS — Z800 Family history of malignant neoplasm of digestive organs: Secondary | ICD-10-CM | POA: Diagnosis not present

## 2014-12-27 MED ORDER — SODIUM CHLORIDE 0.9 % IV SOLN: 500.0000 mL | INTRAVENOUS | Status: DC

## 2014-12-27 NOTE — Progress Notes (Signed)
Called to room to assist during endoscopic procedure.  Patient ID and intended procedure confirmed with present staff. Received instructions for my participation in the procedure from the performing physician.  

## 2014-12-27 NOTE — Op Note (Signed)
[facility]  [address]. [place], [address]   COLONOSCOPY PROCEDURE REPORT  PATIENT: Julia Mejia, Julia Mejia  MR#: [id number] BIRTHDATE: January 02, 1961 , 37  yrs. old GENDER: female ENDOSCOPIST: Eustace Quail, MD REFERRED [id number] Program Recall PROCEDURE DATE:  12/27/2014 PROCEDURE:   Colonoscopy, surveillance and Colonoscopy with snare polypectomy x 2 First Screening Colonoscopy - Avg.  risk and is 50 yrs.  old or older - No.  Prior Negative Screening - Now for repeat screening. N/A  History of Adenoma - Now for follow-up colonoscopy & has been > or = to 3 yrs.  Yes hx of adenoma.  Has been 3 or more years since last colonoscopy.  Polyps removed today? Yes Polyps Removed Today ASA CLASS:   Class II INDICATIONS:Surveillance due to prior colonic neoplasia and PH Colon Adenoma. Index examination July 2009 with small adenoma. Also history of lymphocytic colitis. MEDICATIONS: Monitored anesthesia care and Propofol 450 mg IV  DESCRIPTION OF PROCEDURE:   After the risks benefits and alternatives of the procedure were thoroughly explained, informed consent was obtained.  The digital rectal exam revealed no abnormalities of the rectum.   The LB [id number]  endoscope was introduced through the anus and advanced to the cecum, which was identified by both the appendix and ileocecal valve. No adverse events experienced.   The quality of the prep was excellent. (MoviPrep was used)  The instrument was then slowly withdrawn as the colon was fully examined. Estimated blood loss is zero unless otherwise noted in this procedure report.  COLON FINDINGS: Two polyps ranging between 3-63mm in size were found in the sigmoid colon and ascending colon.  A polypectomy was performed with a cold snare.  The resection was complete, the polyp tissue was completely retrieved and sent to histology.   There was moderate diverticulosis noted in the sigmoid colon.   The examination was  otherwise normal.  Retroflexed views revealed internal hemorrhoids. The time to cecum = 2.0 Withdrawal time = 15.6   The scope was withdrawn and the procedure completed. COMPLICATIONS: There were no immediate complications.  ENDOSCOPIC IMPRESSION: 1.   Two polyps were found in the sigmoid colon and ascending colon; polypectomy was performed with a cold snare 2.   Moderate diverticulosis was noted in the sigmoid colon 3.   The examination was otherwise normal  RECOMMENDATIONS: 1. Follow up colonoscopy in 5 years  eSigned:  Eustace Quail, MD 12/27/2014 9:29 AM   cc: The Patient and Venia Carbon, MD

## 2014-12-27 NOTE — Patient Instructions (Signed)
YOU HAD AN ENDOSCOPIC PROCEDURE TODAY AT THE [facility]:   Refer to the procedure report that was given to you for any specific questions about what was found during the examination.  If the procedure report does not answer your questions, please call your gastroenterologist to clarify.  If you requested that your care partner not be given the details of your procedure findings, then the procedure report has been included in a sealed envelope for you to review at your convenience later.  YOU SHOULD EXPECT: Some feelings of bloating in the abdomen. Passage of more gas than usual.  Walking can help get rid of the air that was put into your GI tract during the procedure and reduce the bloating. If you had a lower endoscopy (such as a colonoscopy or flexible sigmoidoscopy) you may notice spotting of blood in your stool or on the toilet paper. If you underwent a bowel prep for your procedure, you may not have a normal bowel movement for a few days.  Please Note:  You might notice some irritation and congestion in your nose or some drainage.  This is from the oxygen used during your procedure.  There is no need for concern and it should clear up in a day or so.  SYMPTOMS TO REPORT IMMEDIATELY:   Following lower endoscopy (colonoscopy or flexible sigmoidoscopy):  Excessive amounts of blood in the stool  Significant tenderness or worsening of abdominal pains  Swelling of the abdomen that is new, acute  Fever of 100F or higher   For urgent or emergent issues, a gastroenterologist can be reached at any hour by calling [phone].   DIET: Your first meal following the procedure should be a small meal and then it is ok to progress to your normal diet. Heavy or fried foods are harder to digest and may make you feel nauseous or bloated.  Likewise, meals heavy in dairy and vegetables can increase bloating.  Drink plenty of fluids but you should avoid alcoholic beverages for 24  hours.  ACTIVITY:  You should plan to take it easy for the rest of today and you should NOT DRIVE or use heavy machinery until tomorrow (because of the sedation medicines used during the test).    FOLLOW UP: Our staff will call the number listed on your records the next business day following your procedure to check on you and address any questions or concerns that you may have regarding the information given to you following your procedure. If we do not reach you, we will leave a message.  However, if you are feeling well and you are not experiencing any problems, there is no need to return our call.  We will assume that you have returned to your regular daily activities without incident.  If any biopsies were taken you will be contacted by phone or by letter within the next 1-3 weeks.  Please call us at [phone] if you have not heard about the biopsies in 3 weeks.    SIGNATURES/CONFIDENTIALITY: You and/or your care partner have signed paperwork which will be entered into your electronic medical record.  These signatures attest to the fact that that the information above on your After Visit Summary has been reviewed and is understood.  Full responsibility of the confidentiality of this discharge information lies with you and/or your care-partner.  Polyps, diverticulosis, high fiber diet-handouts given  Repeat colonoscopy in 5 years-2021.  

## 2014-12-27 NOTE — Progress Notes (Signed)
To recovery, report to Mirts, RN, VSS. 

## 2014-12-28 ENCOUNTER — Telehealth: Payer: Self-pay

## 2014-12-28 NOTE — Telephone Encounter (Signed)
  Follow up Call-  Call back number 12/27/2014  Post procedure Call Back phone  # [phone]  Permission to leave phone message Yes     Patient questions:  Do you have a fever, pain , or abdominal swelling? No. Pain Score  0 *  Have you tolerated food without any problems? Yes.    Have you been able to return to your normal activities? Yes.    Do you have any questions about your discharge instructions: Diet   No. Medications  No. Follow up visit  No.  Do you have questions or concerns about your Care? No.  Actions: * If pain score is 4 or above: No action needed, pain <4.  No problems per the pt. maw

## 2015-01-02 ENCOUNTER — Encounter: Payer: Self-pay | Admitting: Internal Medicine

## 2015-01-09 ENCOUNTER — Encounter: Payer: Self-pay | Admitting: Internal Medicine

## 2015-10-12 ENCOUNTER — Other Ambulatory Visit: Payer: Self-pay

## 2015-10-12 MED ORDER — PAROXETINE HCL 20 MG PO TABS: 20.0000 mg | ORAL_TABLET | Freq: Every day | ORAL | Status: DC

## 2015-10-12 NOTE — Telephone Encounter (Signed)
Approved: okay for 1 year again

## 2015-10-12 NOTE — Telephone Encounter (Signed)
Pt left v/m requesting refill paxil to [facility] [address]. Last refilled # 90 x 3 on 10/18/14. Last annual was 10/18/14. Pt has CPX scheduled on 01/09/16.Please advise.

## 2015-10-20 ENCOUNTER — Encounter: Payer: BLUE CROSS/BLUE SHIELD | Admitting: Internal Medicine

## 2016-01-09 ENCOUNTER — Encounter: Payer: Self-pay | Admitting: Internal Medicine

## 2016-01-09 ENCOUNTER — Ambulatory Visit (INDEPENDENT_AMBULATORY_CARE_PROVIDER_SITE_OTHER): Payer: Self-pay | Admitting: Internal Medicine

## 2016-01-09 DIAGNOSIS — J4520 Mild intermittent asthma, uncomplicated: Secondary | ICD-10-CM

## 2016-01-09 DIAGNOSIS — Z0000 Encounter for general adult medical examination without abnormal findings: Secondary | ICD-10-CM

## 2016-01-09 DIAGNOSIS — K219 Gastro-esophageal reflux disease without esophagitis: Secondary | ICD-10-CM

## 2016-01-09 DIAGNOSIS — F39 Unspecified mood [affective] disorder: Secondary | ICD-10-CM

## 2016-01-09 MED ORDER — PAROXETINE HCL 20 MG PO TABS: 20.0000 mg | ORAL_TABLET | Freq: Every day | ORAL | 3 refills | Status: DC

## 2016-01-09 NOTE — Assessment & Plan Note (Signed)
Generally only has symptoms with respiratory infections Has used inhaler in past

## 2016-01-09 NOTE — Assessment & Plan Note (Signed)
Still with lots of stress Will continue the paroxetine

## 2016-01-09 NOTE — Assessment & Plan Note (Signed)
Due for mammogram Sugar checked here Glucose 94 here---reassured

## 2016-01-09 NOTE — Patient Instructions (Signed)
DASH Eating Plan  DASH stands for "Dietary Approaches to Stop Hypertension." The DASH eating plan is a healthy eating plan that has been shown to reduce high blood pressure (hypertension). Additional health benefits may include reducing the risk of type 2 diabetes mellitus, heart disease, and stroke. The DASH eating plan may also help with weight loss.  WHAT DO I NEED TO KNOW ABOUT THE DASH EATING PLAN?  For the DASH eating plan, you will follow these general guidelines:  · Choose foods with a percent daily value for sodium of less than 5% (as listed on the food label).  · Use salt-free seasonings or herbs instead of table salt or sea salt.  · Check with your health care provider or pharmacist before using salt substitutes.  · Eat lower-sodium products, often labeled as "lower sodium" or "no salt added."  · Eat fresh foods.  · Eat more vegetables, fruits, and low-fat dairy products.  · Choose whole grains. Look for the word "whole" as the first word in the ingredient list.  · Choose fish and skinless chicken or turkey more often than red meat. Limit fish, poultry, and meat to 6 oz (170 g) each day.  · Limit sweets, desserts, sugars, and sugary drinks.  · Choose heart-healthy fats.  · Limit cheese to 1 oz (28 g) per day.  · Eat more home-cooked food and less restaurant, buffet, and fast food.  · Limit fried foods.  · Cook foods using methods other than frying.  · Limit canned vegetables. If you do use them, rinse them well to decrease the sodium.  · When eating at a restaurant, ask that your food be prepared with less salt, or no salt if possible.  WHAT FOODS CAN I EAT?  Seek help from a dietitian for individual calorie needs.  Grains  Whole grain or whole wheat bread. Brown rice. Whole grain or whole wheat pasta. Quinoa, bulgur, and whole grain cereals. Low-sodium cereals. Corn or whole wheat flour tortillas. Whole grain cornbread. Whole grain crackers. Low-sodium crackers.  Vegetables  Fresh or frozen vegetables  (raw, steamed, roasted, or grilled). Low-sodium or reduced-sodium tomato and vegetable juices. Low-sodium or reduced-sodium tomato sauce and paste. Low-sodium or reduced-sodium canned vegetables.   Fruits  All fresh, canned (in natural juice), or frozen fruits.  Meat and Other Protein Products  Ground beef (85% or leaner), grass-fed beef, or beef trimmed of fat. Skinless chicken or turkey. Ground chicken or turkey. Pork trimmed of fat. All fish and seafood. Eggs. Dried beans, peas, or lentils. Unsalted nuts and seeds. Unsalted canned beans.  Dairy  Low-fat dairy products, such as skim or 1% milk, 2% or reduced-fat cheeses, low-fat ricotta or cottage cheese, or plain low-fat yogurt. Low-sodium or reduced-sodium cheeses.  Fats and Oils  Tub margarines without trans fats. Light or reduced-fat mayonnaise and salad dressings (reduced sodium). Avocado. Safflower, olive, or canola oils. Natural peanut or almond butter.  Other  Unsalted popcorn and pretzels.  The items listed above may not be a complete list of recommended foods or beverages. Contact your dietitian for more options.  WHAT FOODS ARE NOT RECOMMENDED?  Grains  White bread. White pasta. White rice. Refined cornbread. Bagels and croissants. Crackers that contain trans fat.  Vegetables  Creamed or fried vegetables. Vegetables in a cheese sauce. Regular canned vegetables. Regular canned tomato sauce and paste. Regular tomato and vegetable juices.  Fruits  Dried fruits. Canned fruit in light or heavy syrup. Fruit juice.  Meat and Other Protein   Products  Fatty cuts of meat. Ribs, chicken wings, bacon, sausage, bologna, salami, chitterlings, fatback, hot dogs, bratwurst, and packaged luncheon meats. Salted nuts and seeds. Canned beans with salt.  Dairy  Whole or 2% milk, cream, half-and-half, and cream cheese. Whole-fat or sweetened yogurt. Full-fat cheeses or blue cheese. Nondairy creamers and whipped toppings. Processed cheese, cheese spreads, or cheese  curds.  Condiments  Onion and garlic salt, seasoned salt, table salt, and sea salt. Canned and packaged gravies. Worcestershire sauce. Tartar sauce. Barbecue sauce. Teriyaki sauce. Soy sauce, including reduced sodium. Steak sauce. Fish sauce. Oyster sauce. Cocktail sauce. Horseradish. Ketchup and mustard. Meat flavorings and tenderizers. Bouillon cubes. Hot sauce. Tabasco sauce. Marinades. Taco seasonings. Relishes.  Fats and Oils  Butter, stick margarine, lard, shortening, ghee, and bacon fat. Coconut, palm kernel, or palm oils. Regular salad dressings.  Other  Pickles and olives. Salted popcorn and pretzels.  The items listed above may not be a complete list of foods and beverages to avoid. Contact your dietitian for more information.  WHERE CAN I FIND MORE INFORMATION?  National Heart, Lung, and Blood Institute: www.nhlbi.nih.gov/health/health-topics/topics/dash/     This information is not intended to replace advice given to you by your health care provider. Make sure you discuss any questions you have with your health care provider.     Document Released: 03/28/2011 Document Revised: 04/29/2014 Document Reviewed: 02/10/2013  Elsevier Interactive Patient Education ©2016 Elsevier Inc.

## 2016-01-09 NOTE — Progress Notes (Signed)
Subjective:    Patient ID: Julia Mejia, female    DOB: Jan 26, 1961, 55 y.o.   MRN: [id number]  HPI Here for physical  Recent colonoscopy Plans to schedule mammogram soon--has financial issues since no insurance  Still at her job Slowly winding down--not sure how long it will last  No real fitness efforts Just started a dance step program with her Wii Taking OTC product to reduce appetite --may be helping  Mood has been okay Feels tired at times--but relates to lack of exercise  Keeps up with omeprazole For safety, she has cut back to every other day No dysphagia  Still smokes occasionally--like with stress Discussed stopping  Current Outpatient Prescriptions on File Prior to Visit  Medication Sig Dispense Refill  . cetirizine (ZYRTEC) 10 MG tablet Take 10 mg by mouth daily as needed for allergies.    Marland Kitchen omeprazole (PRILOSEC) 20 MG capsule Take 20 mg by mouth every other day.     Marland Kitchen PARoxetine (PAXIL) 20 MG tablet Take 1 tablet (20 mg total) by mouth at bedtime. 90 tablet 3   No current facility-administered medications on file prior to visit.     Allergies  Allergen Reactions  . Sulfonamide Derivatives     Causes hyperactivity    Past Medical History:  Diagnosis Date  . Allergy   . Anxiety   . Asthma    in winter months  . Depression   . GERD (gastroesophageal reflux disease)   . Lymphocytic colitis    no meds  . Obesity, unspecified   . OSA (obstructive sleep apnea)    on c-pap    Past Surgical History:  Procedure Laterality Date  . ANKLE SURGERY  11/10   right  . COLONOSCOPY    . POLYPECTOMY    . TUBAL LIGATION    . VAGINAL DELIVERY     x2    Family History  Problem Relation Age of Onset  . Diabetes Mother   . Alzheimer's disease Mother   . Osteoporosis Mother   . Diabetes Maternal Aunt   . Rectal cancer Maternal Aunt   . Colon cancer Maternal Aunt   . Cancer Maternal Grandfather     esophageal and colon cancer  . Heart disease  Paternal Uncle     Social History   Social History  . Marital status: Married    Spouse name: N/A  . Number of children: 2  . Years of education: N/A   Occupational History  . 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 Smoking status: Current Some Day Smoker    Packs/day: 0.50    Types: Cigarettes  . Smokeless tobacco: Never Used  . Alcohol use 1.2 oz/week    2 Standard drinks or equivalent per week     Comment: OCC.  . Drug use: No  . Sexual activity: Not on file   Other Topics Concern  . Not on file   Social History Narrative   Husband is disabled    Review of Systems Sleep is interrupted due to nocturia Recent tooth extraction due to infection. Keeps up with dentist No chest pain  No SOB     Objective:   Physical Exam  Constitutional: She appears well-developed. No distress.  Neck: Normal range of motion. Neck supple. No thyromegaly present.  Cardiovascular: Normal rate, regular rhythm, normal heart sounds and intact distal pulses.  Exam reveals no gallop.   No murmur heard. Pulmonary/Chest: Effort normal and breath  sounds normal. No respiratory distress. She has no wheezes. She has no rales.  Abdominal: Soft. There is no tenderness.  Musculoskeletal: She exhibits no edema or tenderness.  Lymphadenopathy:    She has no cervical adenopathy.  Skin: No rash noted. No erythema.  Psychiatric: She has a normal mood and affect. Her behavior is normal.          Assessment & Plan:

## 2016-01-09 NOTE — Assessment & Plan Note (Signed)
Doing okay with every other day omeprazole 

## 2016-01-09 NOTE — Progress Notes (Signed)
Pre visit review using our clinic review tool, if applicable. No additional management support is needed unless otherwise documented below in the visit note. 

## 2016-05-01 ENCOUNTER — Telehealth: Payer: Self-pay | Admitting: Pulmonary Disease

## 2016-05-01 NOTE — Telephone Encounter (Signed)
Received a fax from [facility] stating that Dr. Elsworth Soho placed a referral to them to be fitted for a mouth device for her sleep apnea. Per Dr. Elsworth Soho, he never placed this order and hasn't seen the patient in over 8 years. I called the office to inform them of this and am awaiting a call from their referral dept regarding this.

## 2016-05-02 NOTE — Telephone Encounter (Signed)
No response from office therefore I am closing this note. Will shred papers as well.

## 2017-01-10 ENCOUNTER — Ambulatory Visit: Payer: Self-pay | Admitting: Internal Medicine

## 2017-01-15 ENCOUNTER — Ambulatory Visit (INDEPENDENT_AMBULATORY_CARE_PROVIDER_SITE_OTHER): Payer: Self-pay | Admitting: Internal Medicine

## 2017-01-15 ENCOUNTER — Encounter: Payer: Self-pay | Admitting: Internal Medicine

## 2017-01-15 VITALS — BP 140/88 | HR 102 | Temp 98.1°F | Ht 62.5 in | Wt 247.0 lb

## 2017-01-15 DIAGNOSIS — G4733 Obstructive sleep apnea (adult) (pediatric): Secondary | ICD-10-CM

## 2017-01-15 DIAGNOSIS — Z0000 Encounter for general adult medical examination without abnormal findings: Secondary | ICD-10-CM

## 2017-01-15 DIAGNOSIS — K219 Gastro-esophageal reflux disease without esophagitis: Secondary | ICD-10-CM

## 2017-01-15 DIAGNOSIS — F39 Unspecified mood [affective] disorder: Secondary | ICD-10-CM

## 2017-01-15 MED ORDER — PAROXETINE HCL 20 MG PO TABS: 20.0000 mg | ORAL_TABLET | Freq: Every day | ORAL | 3 refills | Status: AC

## 2017-01-15 NOTE — Assessment & Plan Note (Signed)
Will get flu vaccine at pharmacy Colon due 2021 Fingerstick glucose 126---DASH info, etc given (stop all sugared drinks) Asked her to look into free mammograms if they are available (she is not going otherwise) Pap due 2020 (negative cotesting in 2015)

## 2017-01-15 NOTE — Assessment & Plan Note (Signed)
Doing well on paroxetine Will continue

## 2017-01-15 NOTE — Patient Instructions (Signed)
DASH Eating Plan DASH stands for "Dietary Approaches to Stop Hypertension." The DASH eating plan is a healthy eating plan that has been shown to reduce high blood pressure (hypertension). It may also reduce your risk for type 2 diabetes, heart disease, and stroke. The DASH eating plan may also help with weight loss. What are tips for following this plan? General guidelines  Avoid eating more than 2,300 mg (milligrams) of salt (sodium) a day. If you have hypertension, you may need to reduce your sodium intake to 1,500 mg a day.  Limit alcohol intake to no more than 1 drink a day for nonpregnant women and 2 drinks a day for men. One drink equals 12 oz of beer, 5 oz of wine, or 1 oz of hard liquor.  Work with your health care provider to maintain a healthy body weight or to lose weight. Ask what an ideal weight is for you.  Get at least 30 minutes of exercise that causes your heart to beat faster (aerobic exercise) most days of the week. Activities may include walking, swimming, or biking.  Work with your health care provider or diet and nutrition specialist (dietitian) to adjust your eating plan to your individual calorie needs. Reading food labels  Check food labels for the amount of sodium per serving. Choose foods with less than 5 percent of the Daily Value of sodium. Generally, foods with less than 300 mg of sodium per serving fit into this eating plan.  To find whole grains, look for the word "whole" as the first word in the ingredient list. Shopping  Buy products labeled as "low-sodium" or "no salt added."  Buy fresh foods. Avoid canned foods and premade or frozen meals. Cooking  Avoid adding salt when cooking. Use salt-free seasonings or herbs instead of table salt or sea salt. Check with your health care provider or pharmacist before using salt substitutes.  Do not fry foods. Cook foods using healthy methods such as baking, boiling, grilling, and broiling instead.  Cook with  heart-healthy oils, such as olive, canola, soybean, or sunflower oil. Meal planning   Eat a balanced diet that includes: ? 5 or more servings of fruits and vegetables each day. At each meal, try to fill half of your plate with fruits and vegetables. ? Up to 6-8 servings of whole grains each day. ? Less than 6 oz of lean meat, poultry, or fish each day. A 3-oz serving of meat is about the same size as a deck of cards. One egg equals 1 oz. ? 2 servings of low-fat dairy each day. ? A serving of nuts, seeds, or beans 5 times each week. ? Heart-healthy fats. Healthy fats called Omega-3 fatty acids are found in foods such as flaxseeds and coldwater fish, like sardines, salmon, and mackerel.  Limit how much you eat of the following: ? Canned or prepackaged foods. ? Food that is high in trans fat, such as fried foods. ? Food that is high in saturated fat, such as fatty meat. ? Sweets, desserts, sugary drinks, and other foods with added sugar. ? Full-fat dairy products.  Do not salt foods before eating.  Try to eat at least 2 vegetarian meals each week.  Eat more home-cooked food and less restaurant, buffet, and fast food.  When eating at a restaurant, ask that your food be prepared with less salt or no salt, if possible. What foods are recommended? The items listed may not be a complete list. Talk with your dietitian about what   dietary choices are best for you. Grains Whole-grain or whole-wheat bread. Whole-grain or whole-wheat pasta. Brown rice. Oatmeal. Quinoa. Bulgur. Whole-grain and low-sodium cereals. Pita bread. Low-fat, low-sodium crackers. Whole-wheat flour tortillas. Vegetables Fresh or frozen vegetables (raw, steamed, roasted, or grilled). Low-sodium or reduced-sodium tomato and vegetable juice. Low-sodium or reduced-sodium tomato sauce and tomato paste. Low-sodium or reduced-sodium canned vegetables. Fruits All fresh, dried, or frozen fruit. Canned fruit in natural juice (without  added sugar). Meat and other protein foods Skinless chicken or turkey. Ground chicken or turkey. Pork with fat trimmed off. Fish and seafood. Egg whites. Dried beans, peas, or lentils. Unsalted nuts, nut butters, and seeds. Unsalted canned beans. Lean cuts of beef with fat trimmed off. Low-sodium, lean deli meat. Dairy Low-fat (1%) or fat-free (skim) milk. Fat-free, low-fat, or reduced-fat cheeses. Nonfat, low-sodium ricotta or cottage cheese. Low-fat or nonfat yogurt. Low-fat, low-sodium cheese. Fats and oils Soft margarine without trans fats. Vegetable oil. Low-fat, reduced-fat, or light mayonnaise and salad dressings (reduced-sodium). Canola, safflower, olive, soybean, and sunflower oils. Avocado. Seasoning and other foods Herbs. Spices. Seasoning mixes without salt. Unsalted popcorn and pretzels. Fat-free sweets. What foods are not recommended? The items listed may not be a complete list. Talk with your dietitian about what dietary choices are best for you. Grains Baked goods made with fat, such as croissants, muffins, or some breads. Dry pasta or rice meal packs. Vegetables Creamed or fried vegetables. Vegetables in a cheese sauce. Regular canned vegetables (not low-sodium or reduced-sodium). Regular canned tomato sauce and paste (not low-sodium or reduced-sodium). Regular tomato and vegetable juice (not low-sodium or reduced-sodium). Pickles. Olives. Fruits Canned fruit in a light or heavy syrup. Fried fruit. Fruit in cream or butter sauce. Meat and other protein foods Fatty cuts of meat. Ribs. Fried meat. Bacon. Sausage. Bologna and other processed lunch meats. Salami. Fatback. Hotdogs. Bratwurst. Salted nuts and seeds. Canned beans with added salt. Canned or smoked fish. Whole eggs or egg yolks. Chicken or turkey with skin. Dairy Whole or 2% milk, cream, and half-and-half. Whole or full-fat cream cheese. Whole-fat or sweetened yogurt. Full-fat cheese. Nondairy creamers. Whipped toppings.  Processed cheese and cheese spreads. Fats and oils Butter. Stick margarine. Lard. Shortening. Ghee. Bacon fat. Tropical oils, such as coconut, palm kernel, or palm oil. Seasoning and other foods Salted popcorn and pretzels. Onion salt, garlic salt, seasoned salt, table salt, and sea salt. Worcestershire sauce. Tartar sauce. Barbecue sauce. Teriyaki sauce. Soy sauce, including reduced-sodium. Steak sauce. Canned and packaged gravies. Fish sauce. Oyster sauce. Cocktail sauce. Horseradish that you find on the shelf. Ketchup. Mustard. Meat flavorings and tenderizers. Bouillon cubes. Hot sauce and Tabasco sauce. Premade or packaged marinades. Premade or packaged taco seasonings. Relishes. Regular salad dressings. Where to find more information:  National Heart, Lung, and Blood Institute: www.nhlbi.nih.gov  American Heart Association: www.heart.org Summary  The DASH eating plan is a healthy eating plan that has been shown to reduce high blood pressure (hypertension). It may also reduce your risk for type 2 diabetes, heart disease, and stroke.  With the DASH eating plan, you should limit salt (sodium) intake to 2,300 mg a day. If you have hypertension, you may need to reduce your sodium intake to 1,500 mg a day.  When on the DASH eating plan, aim to eat more fresh fruits and vegetables, whole grains, lean proteins, low-fat dairy, and heart-healthy fats.  Work with your health care provider or diet and nutrition specialist (dietitian) to adjust your eating plan to your individual   calorie needs. This information is not intended to replace advice given to you by your health care provider. Make sure you discuss any questions you have with your health care provider. Document Released: 03/28/2011 Document Revised: 04/01/2016 Document Reviewed: 04/01/2016 Elsevier Interactive Patient Education  2017 Elsevier Inc.  

## 2017-01-15 NOTE — Progress Notes (Signed)
Subjective:    Patient ID: Julia Mejia, female    DOB: 03-Apr-1961, 56 y.o.   MRN: [id number]  HPI Here for follow up of chronic medical conditions  Still employed but no benefits  Mood has been okay with paroxetine Completely stopped smoking--did gain some weight though  Still on prilosec Now every other day--no heartburn on this No dysphagia  Cetirizine for allergies Satisfied with this  Current Outpatient Prescriptions on File Prior to Visit  Medication Sig Dispense Refill  . cetirizine (ZYRTEC) 10 MG tablet Take 10 mg by mouth daily as needed for allergies.    Marland Kitchen GARCINIA CAMBOGIA-CHROMIUM PO Take 2 capsules by mouth daily.    Marland Kitchen omeprazole (PRILOSEC) 20 MG capsule Take 20 mg by mouth every other day.     Marland Kitchen PARoxetine (PAXIL) 20 MG tablet Take 1 tablet (20 mg total) by mouth at bedtime. 90 tablet 3   No current facility-administered medications on file prior to visit.     Allergies  Allergen Reactions  . Sulfonamide Derivatives     Causes hyperactivity    Past Medical History:  Diagnosis Date  . Allergy   . Anxiety   . Asthma    in winter months  . Depression   . GERD (gastroesophageal reflux disease)   . Lymphocytic colitis    no meds  . Obesity, unspecified   . OSA (obstructive sleep apnea)    on c-pap    Past Surgical History:  Procedure Laterality Date  . ANKLE SURGERY  11/10   right  . COLONOSCOPY    . POLYPECTOMY    . TUBAL LIGATION    . VAGINAL DELIVERY     x2    Family History  Problem Relation Age of Onset  . Diabetes Mother   . Alzheimer's disease Mother   . Osteoporosis Mother   . Diabetes Maternal Aunt   . Rectal cancer Maternal Aunt   . Colon cancer Maternal Aunt   . Cancer Maternal Grandfather        esophageal and colon cancer  . Heart disease Paternal Uncle     Social History   Social History  . Marital status: Married    Spouse name: N/A  . Number of children: 2  . Years of education: N/A   Occupational History    . 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 Smoking status: Former Smoker    Packs/day: 0.50    Types: Cigarettes    Quit date: 01/21/2016  . Smokeless tobacco: Never Used  . Alcohol use 1.2 oz/week    2 Standard drinks or equivalent per week     Comment: OCC.  . Drug use: No  . Sexual activity: Not on file   Other Topics Concern  . Not on file   Social History Narrative   Husband is disabled   Review of Systems  Sleeps okay Some daytime somnolence--may be from down time at work Diagnosed with sleep apnea-- but doesn't use the machine Appetite is fine Bowels are good Wears seat belt No urinary problems No back or significant joint pains     Objective:   Physical Exam  Constitutional: She appears well-developed. No distress.  HENT:  Mouth/Throat: Oropharynx is clear and moist. No oropharyngeal exudate.  Neck: No thyromegaly present.  Cardiovascular: Normal rate, regular rhythm, normal heart sounds and intact distal pulses.  Exam reveals no gallop.   No murmur heard. Pulmonary/Chest: Effort normal and breath sounds  normal. No respiratory distress. She has no wheezes. She has no rales.  Abdominal: Soft. There is no tenderness.  Musculoskeletal: She exhibits no edema or tenderness.  Lymphadenopathy:    She has no cervical adenopathy.  Skin: No rash noted.  Psychiatric: She has a normal mood and affect. Her behavior is normal.          Assessment & Plan:

## 2017-01-15 NOTE — Assessment & Plan Note (Signed)
Does well with every other day PPI

## 2017-01-15 NOTE — Assessment & Plan Note (Signed)
Urged her to try the machine again---?nasal prongs

## 2018-01-23 ENCOUNTER — Encounter: Payer: Self-pay | Admitting: Internal Medicine

## 2020-05-30 ENCOUNTER — Encounter: Payer: Self-pay | Admitting: Internal Medicine
